# Patient Record
Sex: Male | Born: 1960 | Race: White | Hispanic: No | Marital: Married | Smoking: Never smoker
Health system: Southern US, Community
[De-identification: ages and names within clinical notes are randomized; demographics above are authoritative.]

## PROBLEM LIST (undated history)

## (undated) DIAGNOSIS — F419 Anxiety disorder, unspecified: Secondary | ICD-10-CM

## (undated) DIAGNOSIS — F329 Major depressive disorder, single episode, unspecified: Secondary | ICD-10-CM

## (undated) DIAGNOSIS — R569 Unspecified convulsions: Secondary | ICD-10-CM

## (undated) DIAGNOSIS — F32A Depression, unspecified: Secondary | ICD-10-CM

## (undated) DIAGNOSIS — Z87442 Personal history of urinary calculi: Secondary | ICD-10-CM

## (undated) DIAGNOSIS — F909 Attention-deficit hyperactivity disorder, unspecified type: Secondary | ICD-10-CM

## (undated) DIAGNOSIS — T7840XA Allergy, unspecified, initial encounter: Secondary | ICD-10-CM

## (undated) DIAGNOSIS — L409 Psoriasis, unspecified: Secondary | ICD-10-CM

## (undated) DIAGNOSIS — Z86010 Personal history of colonic polyps: Secondary | ICD-10-CM

## (undated) HISTORY — PX: OTHER SURGICAL HISTORY: SHX169

## (undated) HISTORY — DX: Major depressive disorder, single episode, unspecified: F32.9

## (undated) HISTORY — PX: TRIGGER FINGER RELEASE: SHX641

## (undated) HISTORY — DX: Anxiety disorder, unspecified: F41.9

## (undated) HISTORY — DX: Attention-deficit hyperactivity disorder, unspecified type: F90.9

## (undated) HISTORY — DX: Unspecified convulsions: R56.9

## (undated) HISTORY — DX: Psoriasis, unspecified: L40.9

## (undated) HISTORY — DX: Allergy, unspecified, initial encounter: T78.40XA

## (undated) HISTORY — DX: Depression, unspecified: F32.A

---

## 2002-08-11 ENCOUNTER — Encounter: Payer: Self-pay | Admitting: Neurology

## 2002-08-11 ENCOUNTER — Ambulatory Visit (HOSPITAL_COMMUNITY): Admission: RE | Admit: 2002-08-11 | Discharge: 2002-08-11 | Payer: Self-pay | Admitting: Neurology

## 2004-08-18 ENCOUNTER — Ambulatory Visit (HOSPITAL_COMMUNITY): Admission: RE | Admit: 2004-08-18 | Discharge: 2004-08-18 | Payer: Self-pay | Admitting: Pulmonary Disease

## 2006-06-25 ENCOUNTER — Ambulatory Visit (HOSPITAL_COMMUNITY): Admission: RE | Admit: 2006-06-25 | Discharge: 2006-06-25 | Payer: Self-pay | Admitting: Pulmonary Disease

## 2012-03-07 DIAGNOSIS — H11062 Recurrent pterygium of left eye: Secondary | ICD-10-CM | POA: Insufficient documentation

## 2012-08-06 HISTORY — PX: EYE SURGERY: SHX253

## 2013-03-14 DIAGNOSIS — H40049 Steroid responder, unspecified eye: Secondary | ICD-10-CM | POA: Insufficient documentation

## 2013-10-16 DIAGNOSIS — L718 Other rosacea: Secondary | ICD-10-CM | POA: Insufficient documentation

## 2014-09-21 DIAGNOSIS — M549 Dorsalgia, unspecified: Secondary | ICD-10-CM | POA: Insufficient documentation

## 2014-09-21 DIAGNOSIS — F900 Attention-deficit hyperactivity disorder, predominantly inattentive type: Secondary | ICD-10-CM | POA: Insufficient documentation

## 2014-09-21 DIAGNOSIS — F319 Bipolar disorder, unspecified: Secondary | ICD-10-CM | POA: Insufficient documentation

## 2014-09-21 DIAGNOSIS — M6530 Trigger finger, unspecified finger: Secondary | ICD-10-CM | POA: Insufficient documentation

## 2015-11-09 DIAGNOSIS — Z0000 Encounter for general adult medical examination without abnormal findings: Secondary | ICD-10-CM | POA: Insufficient documentation

## 2016-01-18 ENCOUNTER — Encounter: Payer: Self-pay | Admitting: Gastroenterology

## 2016-03-13 ENCOUNTER — Ambulatory Visit (AMBULATORY_SURGERY_CENTER): Payer: Self-pay | Admitting: *Deleted

## 2016-03-13 ENCOUNTER — Encounter: Payer: Self-pay | Admitting: Gastroenterology

## 2016-03-13 VITALS — Ht 69.0 in | Wt 165.0 lb

## 2016-03-13 DIAGNOSIS — Z1211 Encounter for screening for malignant neoplasm of colon: Secondary | ICD-10-CM

## 2016-03-13 MED ORDER — NA SULFATE-K SULFATE-MG SULF 17.5-3.13-1.6 GM/177ML PO SOLN: 1.0000 | Freq: Once | ORAL | 0 refills | Status: AC

## 2016-03-27 ENCOUNTER — Encounter: Payer: Self-pay | Admitting: Gastroenterology

## 2016-03-27 ENCOUNTER — Ambulatory Visit (AMBULATORY_SURGERY_CENTER): Payer: PRIVATE HEALTH INSURANCE | Admitting: Gastroenterology

## 2016-03-27 VITALS — BP 113/78 | HR 64 | Temp 98.0°F | Resp 18 | Ht 69.0 in | Wt 165.0 lb

## 2016-03-27 DIAGNOSIS — D125 Benign neoplasm of sigmoid colon: Secondary | ICD-10-CM | POA: Diagnosis not present

## 2016-03-27 DIAGNOSIS — Z1211 Encounter for screening for malignant neoplasm of colon: Secondary | ICD-10-CM

## 2016-03-27 DIAGNOSIS — D123 Benign neoplasm of transverse colon: Secondary | ICD-10-CM

## 2016-03-27 MED ORDER — SODIUM CHLORIDE 0.9 % IV SOLN: 500.0000 mL | INTRAVENOUS | Status: AC

## 2016-03-27 NOTE — Progress Notes (Signed)
Patient did lie down on the bed and brought his knees up to pass a lot of gas. Dr. Ardis Hughs was aware of the situation. Patient's pain was a #2-3 when he was discharged at 11:45am. Patient smiling at this time.

## 2016-03-27 NOTE — Progress Notes (Signed)
Patient was about to be discharged and he stated that his tomach hurt. High abd pain about #6.  Patient given levsin 0.125mg  for relief.  Patient on toilet with A LITTLE RELIEF. Dr. Ardis Hughs eamined the patient and 84ml of simethicone were given around 11 am. Patient is walking around in the unit for relief.  Wife is with him at this time. No discharge yet.

## 2016-03-27 NOTE — Op Note (Signed)
[facility] Patient Name: Craig Nunez Procedure Date: 03/27/2016 9:48 AM MRN: [id number] Endoscopist: Milus Banister , MD Age: 55 Referring MD:  Date of Birth: 07/20/1961 Gender: Male Account #: [id number] Procedure:                Colonoscopy Indications:              Screening for colorectal malignant neoplasm Medicines:                Monitored Anesthesia Care Procedure:                Pre-Anesthesia Assessment:                           - Prior to the procedure, a History and Physical                            was performed, and patient medications and                            allergies were reviewed. The patient's tolerance of                            previous anesthesia was also reviewed. The risks                            and benefits of the procedure and the sedation                            options and risks were discussed with the patient.                            All questions were answered, and informed consent                            was obtained. Prior Anticoagulants: The patient has                            taken no previous anticoagulant or antiplatelet                            agents. ASA Grade Assessment: II - A patient with                            mild systemic disease. After reviewing the risks                            and benefits, the patient was deemed in                            satisfactory condition to undergo the procedure.                           After obtaining informed consent, the colonoscope  was passed under direct vision. Throughout the                            procedure, the patient's blood pressure, pulse, and                            oxygen saturations were monitored continuously. The                            Model PCF-[id number]) scope was introduced                            through the anus and advanced to the the cecum,                            identified by  appendiceal orifice and ileocecal                            valve. The colonoscopy was performed without                            difficulty. The patient tolerated the procedure                            well. The quality of the bowel preparation was                            good. The ileocecal valve, appendiceal orifice, and                            rectum were photographed. Scope In: 9:54:18 AM Scope Out: 10:07:05 AM Scope Withdrawal Time: 0 hours 11 minutes 0 seconds  Total Procedure Duration: 0 hours 12 minutes 47 seconds  Findings:                 Two sessile polyps were found in the sigmoid colon                            and transverse colon. The polyps were 2 to 4 mm in                            size. These polyps were removed with a cold snare.                            Resection and retrieval were complete.                           The exam was otherwise without abnormality on                            direct and retroflexion views. Complications:            No immediate complications. Estimated blood loss:  None. Estimated Blood Loss:     Estimated blood loss: none. Impression:               - Two 2 to 4 mm polyps in the sigmoid colon and in                            the transverse colon, removed with a cold snare.                            Resected and retrieved.                           - The examination was otherwise normal on direct                            and retroflexion views. Recommendation:           - Patient has a contact number available for                            emergencies. The signs and symptoms of potential                            delayed complications were discussed with the                            patient. Return to normal activities tomorrow.                            Written discharge instructions were provided to the                            patient.                           - Resume previous  diet.                           - Continue present medications.                           You will receive a letter within 2-3 weeks with the                            pathology results and my final recommendations.                           If the polyp(s) is proven to be 'pre-cancerous' on                            pathology, you will need repeat colonoscopy in 5                            years. If the polyp(s) is NOT 'precancerous' on  pathology then you should repeat colon cancer                            screening in 10 years with colonoscopy without need                            for colon cancer screening by any method prior to                            then (including stool testing). Milus Banister, MD 03/27/2016 10:09:04 AM This report has been signed electronically.

## 2016-03-27 NOTE — Patient Instructions (Signed)
YOU HAD AN ENDOSCOPIC PROCEDURE TODAY AT THE [facility]:   Refer to the procedure report that was given to you for any specific questions about what was found during the examination.  If the procedure report does not answer your questions, please call your gastroenterologist to clarify.  If you requested that your care partner not be given the details of your procedure findings, then the procedure report has been included in a sealed envelope for you to review at your convenience later.  YOU SHOULD EXPECT: Some feelings of bloating in the abdomen. Passage of more gas than usual.  Walking can help get rid of the air that was put into your GI tract during the procedure and reduce the bloating. If you had a lower endoscopy (such as a colonoscopy or flexible sigmoidoscopy) you may notice spotting of blood in your stool or on the toilet paper. If you underwent a bowel prep for your procedure, you may not have a normal bowel movement for a few days.  Please Note:  You might notice some irritation and congestion in your nose or some drainage.  This is from the oxygen used during your procedure.  There is no need for concern and it should clear up in a day or so.  SYMPTOMS TO REPORT IMMEDIATELY:   Following lower endoscopy (colonoscopy or flexible sigmoidoscopy):  Excessive amounts of blood in the stool  Significant tenderness or worsening of abdominal pains  Swelling of the abdomen that is new, acute  Fever of 100F or higher   For urgent or emergent issues, a gastroenterologist can be reached at any hour by calling [phone].   DIET:  We do recommend a small meal at first, but then you may proceed to your regular diet.  Drink plenty of fluids but you should avoid alcoholic beverages for 24 hours.  ACTIVITY:  You should plan to take it easy for the rest of today and you should NOT DRIVE or use heavy machinery until tomorrow (because of the sedation medicines used during the test).     FOLLOW UP: Our staff will call the number listed on your records the next business day following your procedure to check on you and address any questions or concerns that you may have regarding the information given to you following your procedure. If we do not reach you, we will leave a message.  However, if you are feeling well and you are not experiencing any problems, there is no need to return our call.  We will assume that you have returned to your regular daily activities without incident.  If any biopsies were taken you will be contacted by phone or by letter within the next 1-3 weeks.  Please call us at [phone] if you have not heard about the biopsies in 3 weeks.    SIGNATURES/CONFIDENTIALITY: You and/or your care partner have signed paperwork which will be entered into your electronic medical record.  These signatures attest to the fact that that the information above on your After Visit Summary has been reviewed and is understood.  Full responsibility of the confidentiality of this discharge information lies with you and/or your care-partner.  Read all of the handouts given to you by your recovery room nurse. Thank-you for choosing us for your healthcare needs today. 

## 2016-03-27 NOTE — Progress Notes (Signed)
Called to room to assist during endoscopic procedure.  Patient ID and intended procedure confirmed with present staff. Received instructions for my participation in the procedure from the performing physician.  

## 2016-03-27 NOTE — Progress Notes (Signed)
Report to PACU, RN, vss, BBS= Clear.  

## 2016-03-28 ENCOUNTER — Telehealth: Payer: Self-pay | Admitting: *Deleted

## 2016-03-28 NOTE — Telephone Encounter (Signed)
  Follow up Call-  Call back number 03/27/2016  Post procedure Call Back phone  # [phone]  Permission to leave phone message Yes  Some recent data might be hidden     Patient questions:  Do you have a fever, pain , or abdominal swelling? No. Pain Score  0 *  Have you tolerated food without any problems? Yes.    Have you been able to return to your normal activities? Yes.    Do you have any questions about your discharge instructions: Diet   No. Medications  No. Follow up visit  No.  Do you have questions or concerns about your Care? No.  Actions: * If pain score is 4 or above: No action needed, pain <4.

## 2016-03-30 ENCOUNTER — Encounter: Payer: Self-pay | Admitting: Gastroenterology

## 2016-11-23 ENCOUNTER — Encounter (HOSPITAL_COMMUNITY): Payer: Self-pay | Admitting: Emergency Medicine

## 2016-11-23 ENCOUNTER — Emergency Department (HOSPITAL_COMMUNITY)
Admission: EM | Admit: 2016-11-23 | Discharge: 2016-11-23 | Disposition: A | Discharge status: Home / Self Care | Payer: No Typology Code available for payment source | Attending: Emergency Medicine | Admitting: Emergency Medicine

## 2016-11-23 ENCOUNTER — Emergency Department (HOSPITAL_COMMUNITY): Payer: No Typology Code available for payment source

## 2016-11-23 DIAGNOSIS — N200 Calculus of kidney: Secondary | ICD-10-CM

## 2016-11-23 DIAGNOSIS — Z79899 Other long term (current) drug therapy: Secondary | ICD-10-CM | POA: Insufficient documentation

## 2016-11-23 DIAGNOSIS — N1330 Unspecified hydronephrosis: Secondary | ICD-10-CM | POA: Diagnosis not present

## 2016-11-23 DIAGNOSIS — F909 Attention-deficit hyperactivity disorder, unspecified type: Secondary | ICD-10-CM | POA: Insufficient documentation

## 2016-11-23 DIAGNOSIS — R1031 Right lower quadrant pain: Secondary | ICD-10-CM | POA: Diagnosis present

## 2016-11-23 DIAGNOSIS — N202 Calculus of kidney with calculus of ureter: Secondary | ICD-10-CM | POA: Insufficient documentation

## 2016-11-23 LAB — CBC
HCT: 39.5 % (ref 39.0–52.0)
Hemoglobin: 13.3 g/dL (ref 13.0–17.0)
MCH: 29.1 pg (ref 26.0–34.0)
MCHC: 33.7 g/dL (ref 30.0–36.0)
MCV: 86.4 fL (ref 78.0–100.0)
PLATELETS: 247 10*3/uL (ref 150–400)
RBC: 4.57 MIL/uL (ref 4.22–5.81)
RDW: 12.6 % (ref 11.5–15.5)
WBC: 7.4 10*3/uL (ref 4.0–10.5)

## 2016-11-23 LAB — URINALYSIS, ROUTINE W REFLEX MICROSCOPIC
BILIRUBIN URINE: NEGATIVE
GLUCOSE, UA: NEGATIVE mg/dL
Ketones, ur: NEGATIVE mg/dL
LEUKOCYTES UA: NEGATIVE
Nitrite: NEGATIVE
Protein, ur: 30 mg/dL — AB
SPECIFIC GRAVITY, URINE: 1.021 (ref 1.005–1.030)
Squamous Epithelial / LPF: NONE SEEN
pH: 5 (ref 5.0–8.0)

## 2016-11-23 LAB — COMPREHENSIVE METABOLIC PANEL
ALT: 24 U/L (ref 17–63)
AST: 19 U/L (ref 15–41)
Albumin: 3.7 g/dL (ref 3.5–5.0)
Alkaline Phosphatase: 66 U/L (ref 38–126)
Anion gap: 11 (ref 5–15)
BILIRUBIN TOTAL: 0.6 mg/dL (ref 0.3–1.2)
BUN: 15 mg/dL (ref 6–20)
CALCIUM: 9.4 mg/dL (ref 8.9–10.3)
CHLORIDE: 104 mmol/L (ref 101–111)
CO2: 26 mmol/L (ref 22–32)
CREATININE: 1.13 mg/dL (ref 0.61–1.24)
Glucose, Bld: 116 mg/dL — ABNORMAL HIGH (ref 65–99)
Potassium: 3.6 mmol/L (ref 3.5–5.1)
Sodium: 141 mmol/L (ref 135–145)
TOTAL PROTEIN: 6.2 g/dL — AB (ref 6.5–8.1)

## 2016-11-23 LAB — LIPASE, BLOOD: LIPASE: 19 U/L (ref 11–51)

## 2016-11-23 MED ORDER — KETOROLAC TROMETHAMINE 30 MG/ML IJ SOLN
30.0000 mg | Freq: Once | INTRAMUSCULAR | Status: AC
  Administered 2016-11-23: 30 mg via INTRAVENOUS
  Filled 2016-11-23: qty 1

## 2016-11-23 MED ORDER — TAMSULOSIN HCL 0.4 MG PO CAPS: 0.4000 mg | ORAL_CAPSULE | Freq: Two times a day (BID) | ORAL | 0 refills | Status: DC

## 2016-11-23 MED ORDER — OXYCODONE-ACETAMINOPHEN 5-325 MG PO TABS: 1.0000 | ORAL_TABLET | ORAL | 0 refills | Status: DC | PRN

## 2016-11-23 MED ORDER — ONDANSETRON HCL 4 MG/2ML IJ SOLN
4.0000 mg | Freq: Once | INTRAMUSCULAR | Status: AC
  Administered 2016-11-23: 4 mg via INTRAVENOUS
  Filled 2016-11-23: qty 2

## 2016-11-23 MED ORDER — MORPHINE SULFATE (PF) 4 MG/ML IV SOLN
4.0000 mg | Freq: Once | INTRAVENOUS | Status: AC
Start: 2016-11-23 — End: 2016-11-23
  Administered 2016-11-23: 4 mg via INTRAVENOUS
  Filled 2016-11-23: qty 1

## 2016-11-23 MED ORDER — SODIUM CHLORIDE 0.9 % IV BOLUS (SEPSIS)
1000.0000 mL | Freq: Once | INTRAVENOUS | Status: AC
  Administered 2016-11-23: 1000 mL via INTRAVENOUS

## 2016-11-23 MED ORDER — ONDANSETRON 4 MG PO TBDP: 4.0000 mg | ORAL_TABLET | Freq: Three times a day (TID) | ORAL | 0 refills | Status: DC | PRN

## 2016-11-23 NOTE — ED Notes (Signed)
Returned to ED from CT.  ?

## 2016-11-23 NOTE — ED Triage Notes (Signed)
Pt from home with c/o right sided lower back radiating to the right side of his abdomen. Pain has been intermittent. Pt reports dark urine, nausea, and chills.

## 2016-11-23 NOTE — ED Provider Notes (Signed)
[facility] DEPT Provider Note   CSN: [id number] Arrival date & time: 11/23/16  0636     History   Chief Complaint Chief Complaint  Patient presents with  . Back Pain  . Abdominal Pain    HPI Craig Nunez is a 56 y.o. male.  HPI   Patient is a 56 year old male with history of depression who presents the ED with complaint of right flank pain, onset 6 PM last night. Patient reports he has been having waxing and waning sharp pressure-like pain to his right flank that radiates around to his right lower abdomen. Denies any aggravating or alleviating factors. Denies history of similar symptoms. Patient reports when the pain gets severe he has associated nausea, chills and diaphoresis which resolved when the pain improves. He states he took Tylenol this morning without relief. He also reports having mild dysuria 2 days ago which has since resolved. Denies fever, cough, shortness of breath, chest pain, vomiting, diarrhea, hematuria, saddle anesthesia, numbness, weakness. Denies history of abdominal surgeries. Denies history of kidney stones. Denies any recent fall, trauma or heavy lifting.  Past Medical History:  Diagnosis Date  . ADHD (attention deficit hyperactivity disorder)   . Allergy   . Anxiety   . Depression   . Seizures ([place])    1997 from 02 deprivation    There are no active problems to display for this patient.   Past Surgical History:  Procedure Laterality Date  . EYE SURGERY  2014   left eye   . TRIGGER FINGER RELEASE     11-05-2014 right index middle        Home Medications    Prior to Admission medications   Medication Sig Start Date End Date Taking? Authorizing Provider  Chlorpheniramine-PSE-Ibuprofen (ADVIL ALLERGY SINUS) 2-30-200 MG TABS Take 1 tablet by mouth daily as needed (sinus pain).    Yes Historical Provider, MD  desvenlafaxine (PRISTIQ) 50 MG 24 hr tablet Take 50 mg by mouth daily.    Yes Historical Provider, MD  lamoTRIgine (LAMICTAL) 100  MG tablet Take 200 mg by mouth at bedtime.    Yes Historical Provider, MD  ondansetron (ZOFRAN ODT) 4 MG disintegrating tablet Take 1 tablet (4 mg total) by mouth every 8 (eight) hours as needed for nausea or vomiting. 11/23/16   Nona Dell, PA-C  oxyCODONE-acetaminophen (PERCOCET/ROXICET) 5-325 MG tablet Take 1-2 tablets by mouth every 4 (four) hours as needed for severe pain. 11/23/16   Nona Dell, PA-C  tamsulosin (FLOMAX) 0.4 MG CAPS capsule Take 1 capsule (0.4 mg total) by mouth 2 (two) times daily. 11/23/16   Nona Dell, PA-C    Family History Family History  Problem Relation Age of Onset  . Breast cancer Mother   . Breast cancer Sister   . Colon polyps Brother   . Colon cancer Neg Hx   . Esophageal cancer Neg Hx   . Rectal cancer Neg Hx   . Stomach cancer Neg Hx     Social History Social History  Substance Use Topics  . Smoking status: Never Smoker  . Smokeless tobacco: Never Used  . Alcohol use No     Allergies   Penicillins   Review of Systems Review of Systems  Constitutional: Positive for chills (intermittent) and diaphoresis (intermittent).  Gastrointestinal: Positive for abdominal pain (RLQ) and nausea.  Genitourinary: Positive for dysuria and flank pain (right).  All other systems reviewed and are negative.    Physical Exam Updated Vital Signs BP 119/77  Pulse (!) 58   Temp 97.5 F (36.4 C) (Oral)   Resp 12   Ht 5\' 9"  (1.753 m)   Wt 77.1 kg   SpO2 99%   BMI 25.10 kg/m   Physical Exam  Constitutional: He is oriented to person, place, and time. He appears well-developed and well-nourished. No distress.  HENT:  Head: Normocephalic and atraumatic.  Mouth/Throat: Uvula is midline, oropharynx is clear and moist and mucous membranes are normal. No oropharyngeal exudate, posterior oropharyngeal edema, posterior oropharyngeal erythema or tonsillar abscesses. No tonsillar exudate.  Eyes: Conjunctivae and EOM are  normal. Right eye exhibits no discharge. Left eye exhibits no discharge. No scleral icterus.  Neck: Normal range of motion. Neck supple.  Cardiovascular: Normal rate, regular rhythm, normal heart sounds and intact distal pulses.   Pulmonary/Chest: Effort normal and breath sounds normal. No respiratory distress. He has no wheezes. He has no rales. He exhibits no tenderness.  Abdominal: Soft. Normal appearance and bowel sounds are normal. He exhibits no distension and no mass. There is tenderness. There is CVA tenderness (right). There is no rigidity, no rebound, no guarding and negative Murphy's sign. No hernia.  Mild TTP over lower abdominal quadrants.  Musculoskeletal: Normal range of motion. He exhibits no edema or tenderness.  No midline C, T, or L tenderness. Full range of motion of neck and back. Full range of motion of bilateral upper and lower extremities, with 5/5 strength. Sensation intact. 2+ radial and PT pulses. Cap refill <2 seconds.   Neurological: He is alert and oriented to person, place, and time.  Skin: Skin is warm and dry. He is not diaphoretic.  Nursing note and vitals reviewed.    ED Treatments / Results  Labs (all labs ordered are listed, but only abnormal results are displayed) Labs Reviewed  COMPREHENSIVE METABOLIC PANEL - Abnormal; Notable for the following:       Result Value   Glucose, Bld 116 (*)    Total Protein 6.2 (*)    All other components within normal limits  URINALYSIS, ROUTINE W REFLEX MICROSCOPIC - Abnormal; Notable for the following:    APPearance HAZY (*)    Hgb urine dipstick MODERATE (*)    Protein, ur 30 (*)    Bacteria, UA MANY (*)    All other components within normal limits  URINE CULTURE  LIPASE, BLOOD  CBC    EKG  EKG Interpretation None       Radiology Ct Renal Stone Study  Result Date: 11/23/2016 CLINICAL DATA:  On and off right flank pain since yesterday. EXAM: CT ABDOMEN AND PELVIS WITHOUT CONTRAST TECHNIQUE:  Multidetector CT imaging of the abdomen and pelvis was performed following the standard protocol without IV contrast. COMPARISON:  None. FINDINGS: Lower chest:  Negative Hepatobiliary: Subcentimeter low-density in the left liver, usually a cyst.Cholelithiasis without signs of cholecystitis. No common bile duct distention. Pancreas: Unremarkable. Spleen: Unremarkable. Adrenals/Urinary Tract:  Negative adrenals. 2 mm stone in the mid right ureter, at the level of L4. Mild hydronephrosis and upper hydroureter on the right. 3 mm right renal calculus. No left-sided hydronephrosis. Negative decompressed bladder. Stomach/Bowel:  No obstruction. No appendicitis. Vascular/Lymphatic: No acute vascular abnormality. No mass or adenopathy. Reproductive:Nonspecific prostate calcifications. Other: No ascites or pneumoperitoneum. Musculoskeletal: No acute abnormalities. IMPRESSION: 1. 2 mm right mid ureteral stone with mild hydronephrosis. 2. 3 mm right renal calculus. 3. Cholelithiasis. Electronically Signed   By: Monte Fantasia M.D.   On: 11/23/2016 08:27    Procedures Procedures (  including critical care time)  Medications Ordered in ED Medications  sodium chloride 0.9 % bolus 1,000 mL (0 mLs Intravenous Stopped 11/23/16 1022)  ketorolac (TORADOL) 30 MG/ML injection 30 mg (30 mg Intravenous Given 11/23/16 0743)  morphine 4 MG/ML injection 4 mg (4 mg Intravenous Given 11/23/16 0854)  ondansetron (ZOFRAN) injection 4 mg (4 mg Intravenous Given 11/23/16 0854)     Initial Impression / Assessment and Plan / ED Course  I have reviewed the triage vital signs and the nursing notes.  Pertinent labs & imaging results that were available during my care of the patient were reviewed by me and considered in my medical decision making (see chart for details).    Pt presents with waxing and waning right flank pain with intermittent radiation to his right lower quadrant that started last night. Endorses associated nausea. Denies  fever. Denies history of kidney stones. VSS. Exam revealed right CVA tenderness, mild tenderness over lower abdominal quadrants, no peritoneal signs. No midline spinal tenderness. Remaining exam unremarkable. Patient given IV fluids and Toradol in the ED, declined antiemetics and narcotics. UA positive for hgb. Labs unremarkable. Will order CT renal study for further evaluation of suspected kidney stone.  CT showed 2 mm right mid ureteral stone with mild hydronephrosis, 3 mm right renal calculus. Serum creatine WNL, vitals sign stable and the pt does not have irratractable vomiting. Reevaluation patient is sitting resting comfortably in bed with pain controlled. Patient able to tolerate PO. Discussed results and plan for discharge with patient. Plan to discharge patient home with pain meds, antiemetics, Flomax and symptomatically treatment. Patient given outpatient follow-up information as needed. Discussed return precautions.  Final Clinical Impressions(s) / ED Diagnoses   Final diagnoses:  Kidney stone    New Prescriptions Discharge Medication List as of 11/23/2016 10:35 AM    START taking these medications   Details  ondansetron (ZOFRAN ODT) 4 MG disintegrating tablet Take 1 tablet (4 mg total) by mouth every 8 (eight) hours as needed for nausea or vomiting., Starting Fri 11/23/2016, Print    oxyCODONE-acetaminophen (PERCOCET/ROXICET) 5-325 MG tablet Take 1-2 tablets by mouth every 4 (four) hours as needed for severe pain., Starting Fri 11/23/2016, Print    tamsulosin (FLOMAX) 0.4 MG CAPS capsule Take 1 capsule (0.4 mg total) by mouth 2 (two) times daily., Starting Fri 11/23/2016, Print         [place], PA-C 11/23/16 [place], MD 11/24/16 [phone]

## 2016-11-23 NOTE — ED Notes (Signed)
Pt given ginger ale.

## 2016-11-23 NOTE — ED Notes (Signed)
Patient transported to CT 

## 2016-11-23 NOTE — Discharge Instructions (Signed)
Take your medications as prescribed as needed for pain and nausea. Continue drinking water at home to remain hydrated. Strain your urine for the next few days until you have passed your kidney stone. I recommend following up with your primary care provider on Monday for her symptoms have not improved. Please return to the Emergency Department if symptoms worsen or new onset of fever, chest pain, difficulty breathing, new/worsening abdominal pain, vomiting, unable to keep fluids down, difficulty urinating.

## 2016-11-23 NOTE — ED Notes (Signed)
Strainer given  

## 2016-11-24 LAB — URINE CULTURE: Culture: NO GROWTH

## 2018-03-18 ENCOUNTER — Other Ambulatory Visit: Payer: Self-pay | Admitting: Urology

## 2018-03-24 ENCOUNTER — Encounter (HOSPITAL_COMMUNITY): Payer: Self-pay | Admitting: General Practice

## 2018-03-27 ENCOUNTER — Ambulatory Visit (HOSPITAL_COMMUNITY): Payer: PRIVATE HEALTH INSURANCE

## 2018-03-27 ENCOUNTER — Encounter (HOSPITAL_COMMUNITY): Payer: Self-pay | Admitting: *Deleted

## 2018-03-27 ENCOUNTER — Ambulatory Visit (HOSPITAL_COMMUNITY)
Admission: RE | Admit: 2018-03-27 | Discharge: 2018-03-27 | Disposition: A | Discharge status: Home / Self Care | Payer: PRIVATE HEALTH INSURANCE | Source: Other Acute Inpatient Hospital | Attending: Urology | Admitting: Urology

## 2018-03-27 ENCOUNTER — Encounter (HOSPITAL_COMMUNITY)
Admission: RE | Disposition: A | Discharge status: Home / Self Care | Payer: Self-pay | Source: Other Acute Inpatient Hospital | Attending: Urology

## 2018-03-27 ENCOUNTER — Other Ambulatory Visit: Payer: Self-pay

## 2018-03-27 DIAGNOSIS — L409 Psoriasis, unspecified: Secondary | ICD-10-CM | POA: Diagnosis not present

## 2018-03-27 DIAGNOSIS — N202 Calculus of kidney with calculus of ureter: Secondary | ICD-10-CM | POA: Diagnosis present

## 2018-03-27 DIAGNOSIS — Z880 Allergy status to penicillin: Secondary | ICD-10-CM | POA: Diagnosis not present

## 2018-03-27 DIAGNOSIS — F329 Major depressive disorder, single episode, unspecified: Secondary | ICD-10-CM | POA: Diagnosis not present

## 2018-03-27 DIAGNOSIS — Z79899 Other long term (current) drug therapy: Secondary | ICD-10-CM | POA: Diagnosis not present

## 2018-03-27 DIAGNOSIS — N201 Calculus of ureter: Secondary | ICD-10-CM

## 2018-03-27 HISTORY — PX: EXTRACORPOREAL SHOCK WAVE LITHOTRIPSY: SHX1557

## 2018-03-27 HISTORY — DX: Personal history of urinary calculi: Z87.442

## 2018-03-27 HISTORY — DX: Personal history of colonic polyps: Z86.010

## 2018-03-27 SURGERY — LITHOTRIPSY, ESWL
Anesthesia: LOCAL | Laterality: Right

## 2018-03-27 MED ORDER — OXYCODONE HCL 5 MG PO TABS
5.0000 mg | ORAL_TABLET | ORAL | Status: DC | PRN
  Administered 2018-03-27: 5 mg via ORAL

## 2018-03-27 MED ORDER — OXYCODONE HCL 5 MG PO TABS
ORAL_TABLET | ORAL | Status: AC
  Filled 2018-03-27: qty 1

## 2018-03-27 MED ORDER — DIAZEPAM 5 MG PO TABS
10.0000 mg | ORAL_TABLET | ORAL | Status: AC
  Administered 2018-03-27: 10 mg via ORAL
  Filled 2018-03-27: qty 2

## 2018-03-27 MED ORDER — DIPHENHYDRAMINE HCL 25 MG PO CAPS
25.0000 mg | ORAL_CAPSULE | ORAL | Status: AC
  Administered 2018-03-27: 25 mg via ORAL
  Filled 2018-03-27: qty 1

## 2018-03-27 MED ORDER — SODIUM CHLORIDE 0.9 % IV SOLN
INTRAVENOUS | Status: DC
  Administered 2018-03-27: 07:00:00 via INTRAVENOUS

## 2018-03-27 MED ORDER — CIPROFLOXACIN HCL 500 MG PO TABS
500.0000 mg | ORAL_TABLET | ORAL | Status: AC
  Administered 2018-03-27: 500 mg via ORAL
  Filled 2018-03-27: qty 1

## 2018-03-27 NOTE — Op Note (Signed)
See [facility] OP note scanned into chart. Also because of the size, density, location and other factors that cannot be anticipated I feel this will likely be a staged procedure. This fact supersedes any indication in the scanned [facility] operative note to the contrary.  

## 2018-03-27 NOTE — H&P (Signed)
Craig Nunez is a 57 year-old male established patient who is here for renal calculi.  September 2018: CT 11/23/2016 revealed a 1-2 mm right proximal stone with mild hydroneprhosis. There was also a 1-2 mm RUP stone. UA showed 6-30 rbc and wbc, many bac. Cx was negative. Bun 15, cr 1.13. He had intermittent flow and red urine a few days later then his symptoms resolved. Two weeks later he had flank pain again and frequency. He took another round of tamsulosin. Then his symptoms cleared. UA also normal.   Prior oligospermia eval.   He returns with a renal ultrasound which showed no hydronephrosis resolved. There was a 5 mm hyperechoic density in the right kidney consistent with the stone seen on the CT scan. KUB clear. He increased his water intake.    01/01/18: Patient with history noted above. He presents today with complaints of bilateral lower back pain and flank pain that began about 4 days ago. He states that his pain seems to be more bothersome on the right side. He describes the pain as intermittent with more severe episodes. He has noted some associated nausea. He also notes intermittent chills and sweats. He denies fever, however. He denies exacerbation of voiding symptoms or difficulties voiding. No dysuria or gross hematuria.   01/15/18: He returns today for follow up. He denies seeing a stone pass in the interim. He states that pain has improved. He does continue to have some intermittent right flank pain with associated nausea; however, he states that this pain does not occur on a daily basis and is manageable. He denies any gross hematuria or changes in voiding habits. No fever or chills.   03/03/18: He returns today for follow up. He states that since being seen last he did have an episode of acute right flank pain associated with hematuria about 3 weeks ago. He also had nausea, at the time. He states that this pain lasted for a few hours, then passed. He denies seeing a stone pass. He has  continues to have some intermittent right lower back pain at times. He denies any recent episodes of gross hematuria. He denies exacerbation of voiding symptoms. No gross hematuria, dysuria, fever, or chills.   03/14/18: He returns today for follow up. He denies seeing a stone pass in the interim. Overall, he continues to do well with minimal symptoms. He will note some right flank pain on occasion. He denies gross hematuria, dysuria, nausea, vomiting, fever, or chills. He feels he is voiding at his baseline.   The problem is on the right side. He first stated noticing pain on 11/23/2016. This is not his first kidney stone. He is not currently having flank pain, back pain, groin pain, nausea, vomiting, fever or chills.     ALLERGIES: Penicillins    MEDICATIONS: Tamsulosin Hcl 0.4 mg capsule 1 capsule PO Daily  Clobex 0.05 % spray, non-aerosol  LamoTRIgine 200 MG Oral Tablet Oral  Meloxicam  Pristiq 50 MG Oral Tablet Extended Release 24 Hour Oral  Vyvanse 40 mg capsule     GU PSH: None     PSH Notes: Ankle Surgery   Left eye 2014    Right hand 2016   NON-GU PSH: None   GU PMH: Renal calculus - 03/03/2018, - 01/15/2018, - 01/01/2018, - 05/01/2017, - 01/16/2017 Flank Pain - 01/01/2018 Low back pain - 01/01/2018 Ureteral calculus - 01/16/2017 Joslyn Devon - 2014      PMH Notes:  1898-08-06 00:00:00 - Note: Normal Routine  History And Physical Adult   NON-GU PMH: Personal history of diseases of the skin and subcutaneous tissue, History of psoriasis - October 29, 2012 Personal history of other mental and behavioral disorders, History of depression - 10/29/12    FAMILY HISTORY: Death In The Family Father - Father nephrolithiasis - Father   SOCIAL HISTORY: Marital Status: Married Preferred Language: English; Ethnicity: Not Hispanic Or Latino; Race: White Current Smoking Status: Patient has never smoked.   Tobacco Use Assessment Completed: Used Tobacco in last 30 days? Does not use  smokeless tobacco. Does not drink anymore.  Drinks 3 caffeinated drinks per day. Patient's occupation Theatre manager.    REVIEW OF SYSTEMS:    GU Review Male:   Patient denies frequent urination, hard to postpone urination, burning/ pain with urination, get up at night to urinate, leakage of urine, stream starts and stops, trouble starting your stream, have to strain to urinate , erection problems, and penile pain.  Gastrointestinal (Upper):   Patient denies nausea, vomiting, and indigestion/ heartburn.  Gastrointestinal (Lower):   Patient denies diarrhea and constipation.  Constitutional:   Patient denies fever, night sweats, weight loss, and fatigue.  Skin:   Patient denies skin rash/ lesion and itching.  Eyes:   Patient denies blurred vision and double vision.  Ears/ Nose/ Throat:   Patient denies sore throat and sinus problems.  Hematologic/Lymphatic:   Patient denies swollen glands and easy bruising.  Cardiovascular:   Patient denies leg swelling and chest pains.  Respiratory:   Patient denies shortness of breath and cough.  Endocrine:   Patient denies excessive thirst.  Musculoskeletal:   Patient denies back pain and joint pain.  Neurological:   Patient denies headaches and dizziness.  Psychologic:   Patient denies depression and anxiety.   VITAL SIGNS:      03/14/2018 08:35 AM  Weight 168 lb / 76.2 kg  Height 69 in / 175.26 cm  BP 106/65 mmHg  Pulse 85 /min  Temperature 98.2 F / 36.7 C  BMI 24.8 kg/m   MULTI-SYSTEM PHYSICAL EXAMINATION:    Constitutional: Well-nourished. No physical deformities. Normally developed. Good grooming.  Respiratory: Normal breath sounds. No labored breathing, no use of accessory muscles.   Cardiovascular: Regular rate and rhythm. No murmur, no gallop. Normal temperature, normal extremity pulses, no swelling, no varicosities.   Neurologic / Psychiatric: Oriented to time, oriented to place, oriented to person. No depression, no anxiety,  no agitation.  Gastrointestinal: No mass, no tenderness, no rigidity, non obese abdomen.  Musculoskeletal: Spine, ribs, pelvis no bilateral tenderness. Normal gait and station of head and neck.     PAST DATA REVIEWED:  Source Of History:  Patient  Records Review:   Previous Patient Records  Urine Test Review:   Urinalysis  X-Ray Review: KUB: Reviewed Films.  [facility].T. Stone Protocol: Reviewed Films. Reviewed Report.     03/14/11  Hormones  Testosterone, Total 242.91     PROCEDURES:         Renal Ultrasound (Limited) - 59563  Right Kidney: Length: 10.4 cm Depth:5.0 cm Cortical Width: 1.7 cm Width: 5.2 cm    Right Kidney/Ureter:  ? minimal fullness noted. --- 0.4 cm nonshadowing echogenic focus in the mid kidney ( calcification vs vessel).   Bladder:  PVR = not visualized.                KUB - [id number]  A single view of the abdomen is obtained. A single view of the abdomen is obtained.  No obvious opacities noted within the confines of bilateral renal shadows. There continues to be an opacity along expected anatomical course of his right proximal ureter, measuring approximatly 4 X 4 mm. This appears to have progressed minimally since prior imaging study. Stable pelvic calcifications noted. Normal appearing bowel gas pattern.                Urinalysis w/Scope Dipstick Dipstick Cont'd Micro  Color: Yellow Bilirubin: Neg mg/dL WBC/hpf: 0 - 5/hpf  Appearance: Clear Ketones: Neg mg/dL RBC/hpf: 0 - 2/hpf  Specific Gravity: >1.030 Blood: Neg ery/uL Bacteria: Rare (0-9/hpf)  pH: 5.5 Protein: 2+ mg/dL Cystals: NS (Not Seen)  Glucose: Neg mg/dL Urobilinogen: 0.2 mg/dL Casts: NS (Not Seen)    Nitrites: Neg Trichomonas: Not Present    Leukocyte Esterase: Neg leu/uL Mucous: Present      Epithelial Cells: 0 - 5/hpf      Yeast: NS (Not Seen)      Sperm: Present    ASSESSMENT:      ICD-10 Details  1 GU:   Renal calculus - N20.0    PLAN:           Orders Labs Urine Culture           Document Letter(s):  Created for Patient: Clinical Summary         Notes:   I will send urine for culture today. On KUB today, there continues to be an opacity along expected anatomical course of his right proximal ureter, measuring approximatly 4 X 4 mm. This appears to have progressed minimally since prior imaging study. Right RUS shows minimal fullness. We discussed treatment options in detail today. He would like to consider moving forward with surgical intervention at this time. We discussed risks of both ESWL and ureteroscopy.   For ureteroscopy I described the risks  which include general anesthetic complications, bleeding,  infection, damage to contiguous structures, positioning injury, ureteral  stricture, ureteral avulsion, ureteral injury, need for ureteral stent,  inability to perform ureteroscopy, need for an interval procedure, inability to  clear stone burden, stent discomfort and pain.   -For shockwave lithotripsy  I described the risks which include arrhythmia, kidney contusion, kidney  hemorrhage, need for transfusion, back discomfort, flank ecchymosis, flank abrasion, inability to break up stone, inability to pass stone fragments, [place]str[place], infection associated with obstructing stones, need for different surgical procedure and possible need for repeat shockwave lithotripsy.   I will review imaging studies with his urologist for further recommendations and fill out posting sheet accordingly. He remain on MET for now. I encouraged he continue to hydrate well and strain his urine. Return precuations discussed for fever or progressive pain, nausea, or vomiting. He voiced understanding.

## 2018-03-27 NOTE — Discharge Instructions (Signed)
See [facility] discharge instructions in chart.  

## 2018-04-22 ENCOUNTER — Other Ambulatory Visit: Payer: Self-pay | Admitting: Urology

## 2018-04-22 DIAGNOSIS — N200 Calculus of kidney: Secondary | ICD-10-CM

## 2018-04-22 DIAGNOSIS — N281 Cyst of kidney, acquired: Secondary | ICD-10-CM

## 2018-04-29 ENCOUNTER — Ambulatory Visit
Admission: RE | Admit: 2018-04-29 | Discharge: 2018-04-29 | Disposition: A | Discharge status: Home / Self Care | Payer: PRIVATE HEALTH INSURANCE | Source: Ambulatory Visit | Attending: Urology | Admitting: Urology

## 2018-04-29 DIAGNOSIS — N281 Cyst of kidney, acquired: Secondary | ICD-10-CM

## 2018-04-29 DIAGNOSIS — N200 Calculus of kidney: Secondary | ICD-10-CM

## 2018-05-26 ENCOUNTER — Encounter (HOSPITAL_COMMUNITY): Payer: Self-pay | Admitting: Urology

## 2018-08-22 IMAGING — CT CT RENAL STONE PROTOCOL
2 of 4 series · 10 of 46 positions shown, 11 images · non-contrast
Comparison: None.

CLINICAL DATA: On and off right flank pain since yesterday.

EXAM:
CT ABDOMEN AND PELVIS WITHOUT CONTRAST
TECHNIQUE: Multidetector CT imaging of the abdomen and pelvis was performed
following the standard protocol without IV contrast.

[Series 201: stone study, idose (2) · axial · 0.79mm/px · z∈[+104,+529]mm · 7 of 103 slices shown, 8 images]
[im 9/103  soft-tissue]
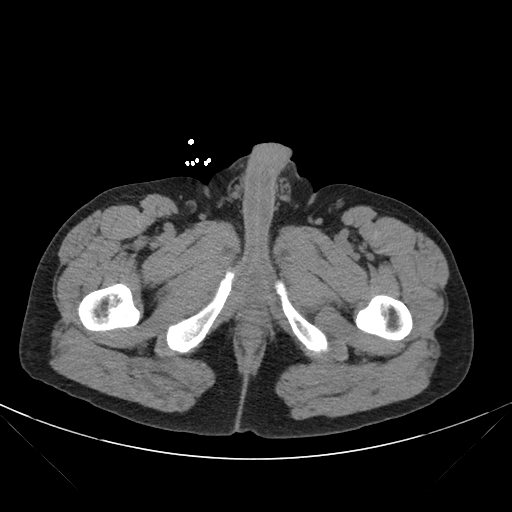
[im 9/103  bone]
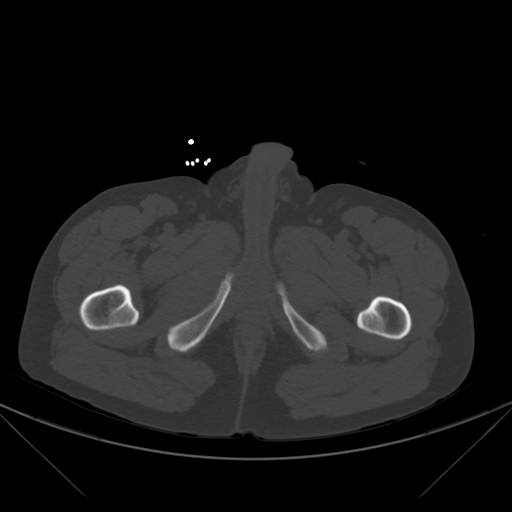
[im 23/103  soft-tissue]
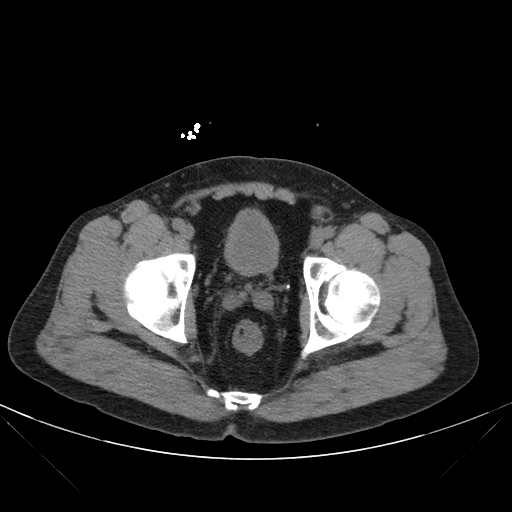
[im 36/103  soft-tissue]
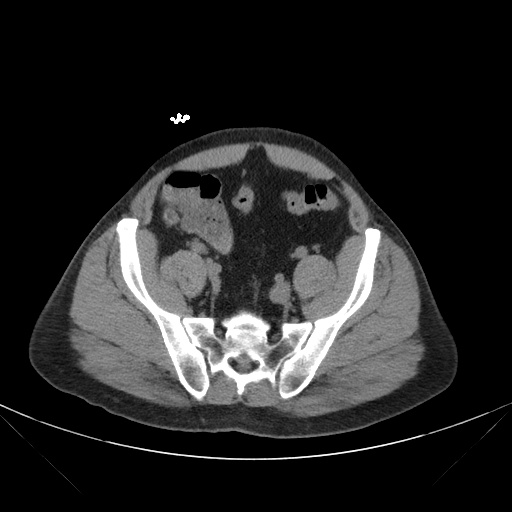
[im 54/103  soft-tissue]
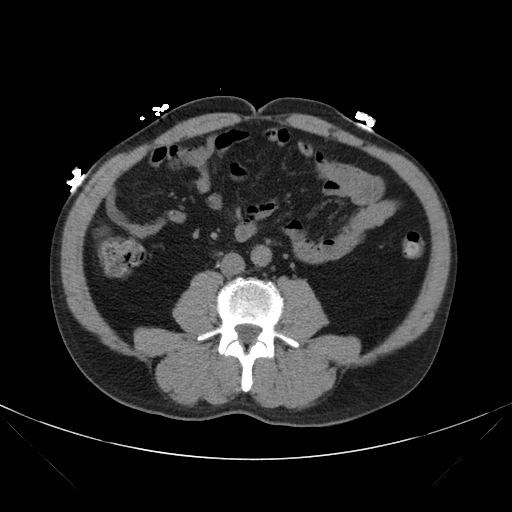
[im 67/103  soft-tissue]
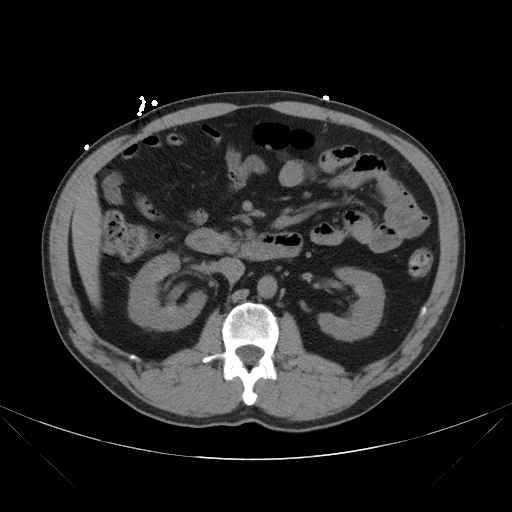
[im 80/103  soft-tissue]
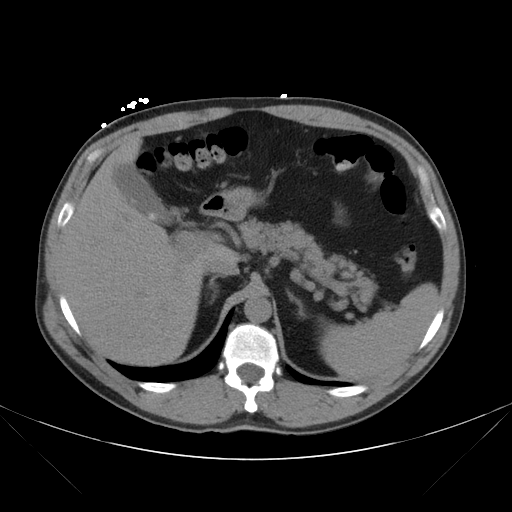
[im 94/103  soft-tissue]
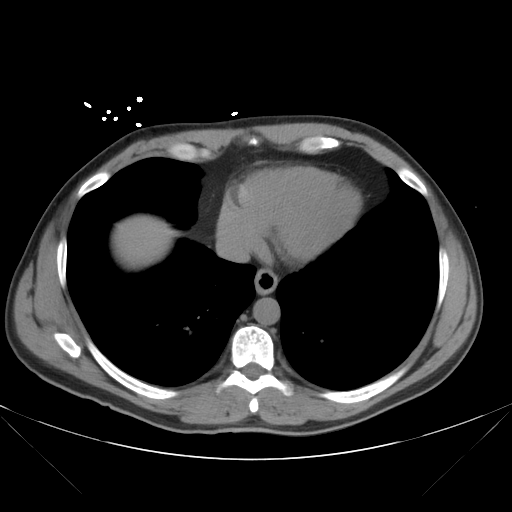

[Series 203: coronals, idose (2) · coronal · 0.45mm/px · 3 of 139 slices shown]
[im 47/139  soft-tissue]
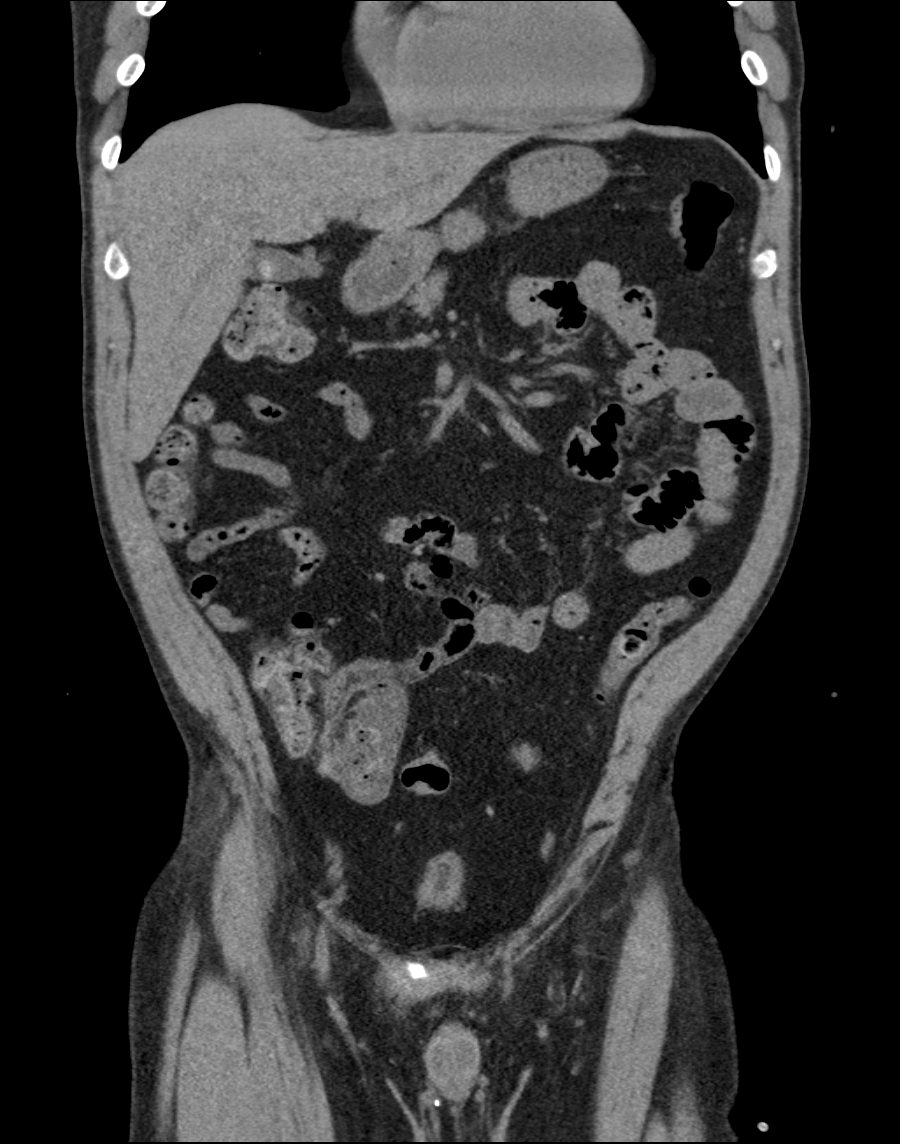
[im 62/139  soft-tissue]
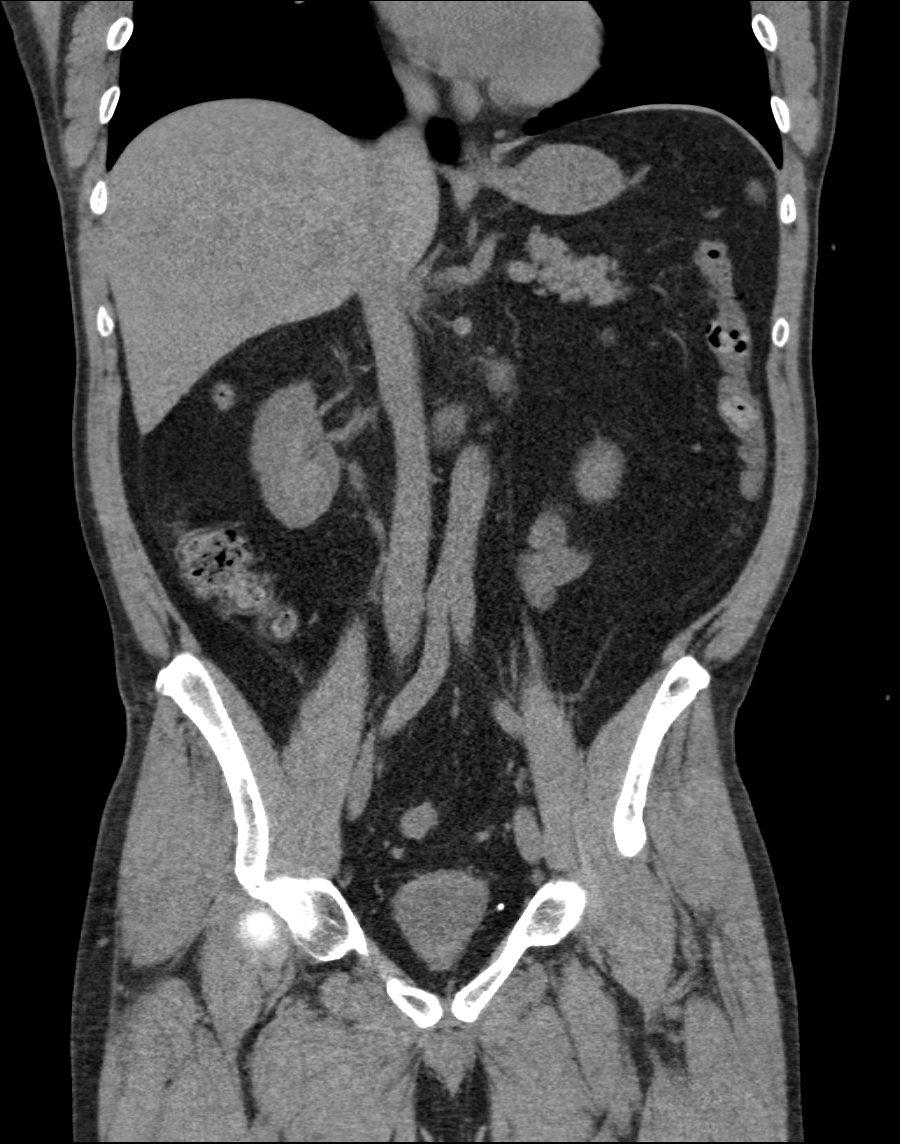
[im 77/139  soft-tissue]
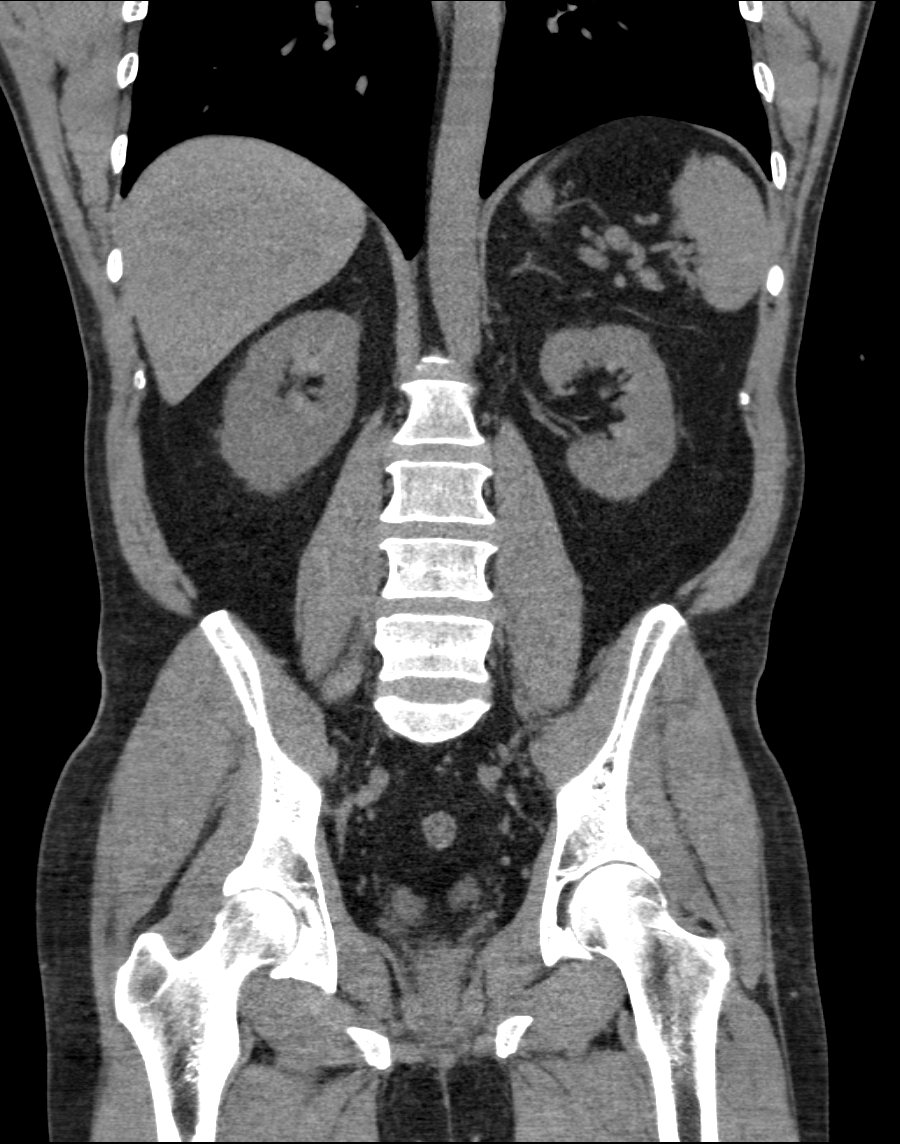

[10 of 46 positions shown; findings below may reference images not displayed]

FINDINGS: Lower chest:  Negative

Hepatobiliary: Subcentimeter low-density in the left liver, usually
a cyst.Cholelithiasis without signs of cholecystitis. No common bile
duct distention.

Pancreas: Unremarkable.

Spleen: Unremarkable.

Adrenals/Urinary Tract:  Negative adrenals.

2 mm stone in the mid right ureter, at the level of L4. Mild
hydronephrosis and upper hydroureter on the right. 3 mm right renal
calculus. No left-sided hydronephrosis.

Negative decompressed bladder.

Stomach/Bowel:  No obstruction. No appendicitis.

Vascular/Lymphatic: No acute vascular abnormality. No mass or
adenopathy.

Reproductive:Nonspecific prostate calcifications.

Other: No ascites or pneumoperitoneum.

Musculoskeletal: No acute abnormalities.
IMPRESSION: 1. 2 mm right mid ureteral stone with mild hydronephrosis.
2. 3 mm right renal calculus.
3. Cholelithiasis.

## 2019-11-18 IMAGING — US US RENAL
1 series · 14 of 25 positions shown · non-contrast
Comparison: 04/17/2018

CLINICAL DATA: History of renal calculi, possible cystic change

EXAM:
RENAL / URINARY TRACT ULTRASOUND COMPLETE

[Series 1: us renal · 0.23mm/px · 14 of 42 slices shown]
[im 1/42]
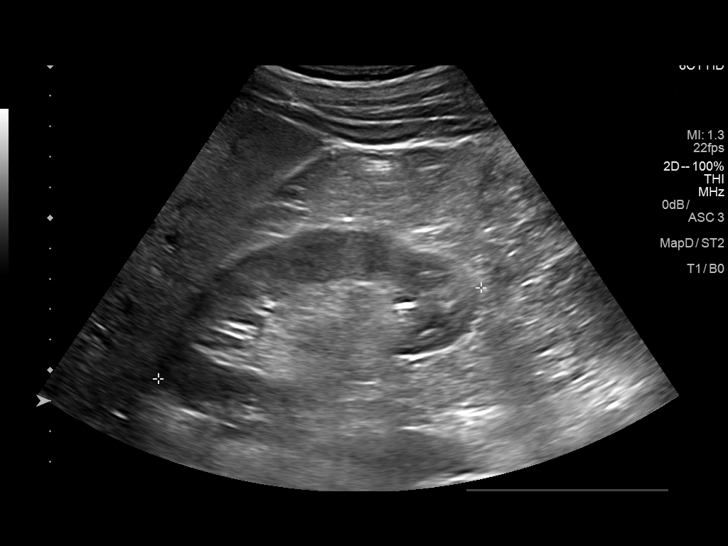
[im 4/42]
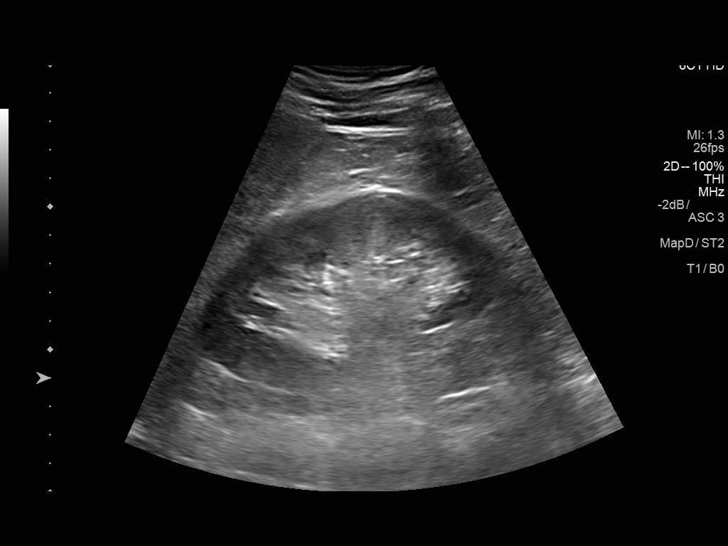
[im 7/42]
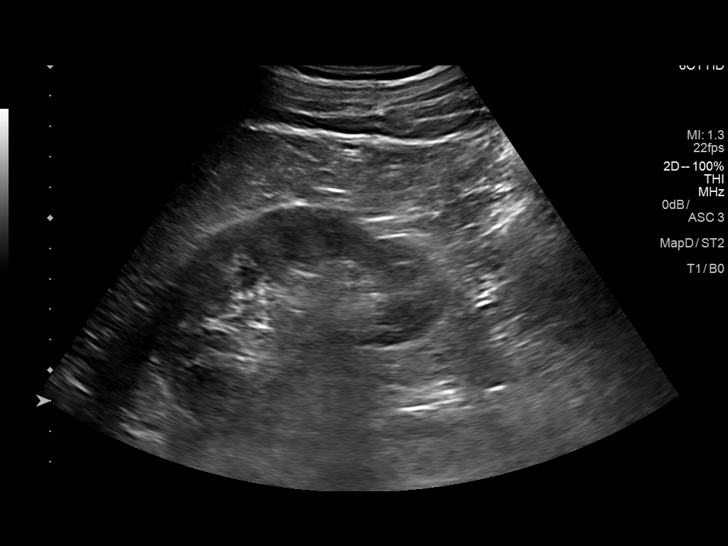
[im 11/42]
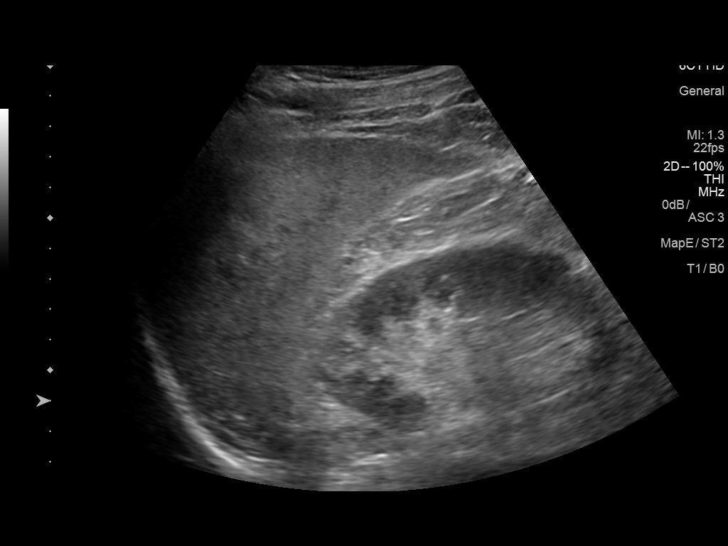
[im 14/42]
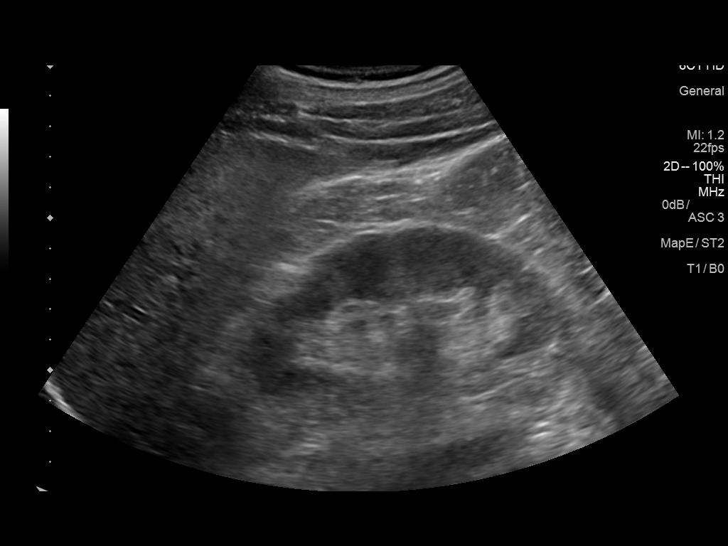
[im 16/42]
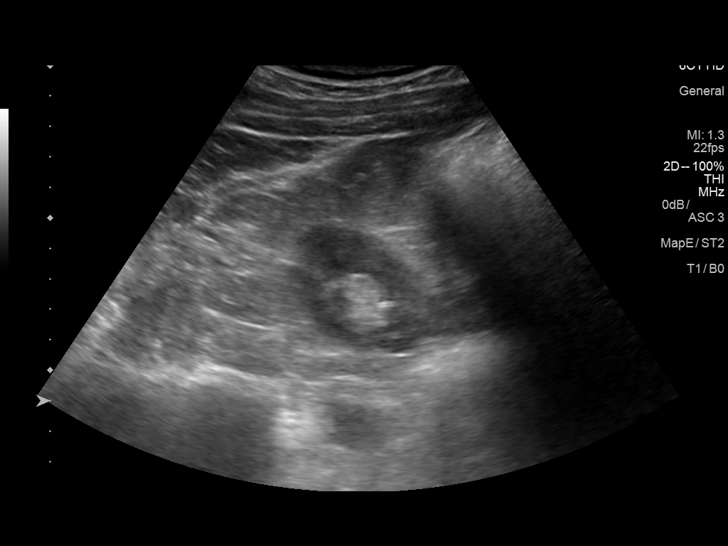
[im 19/42]
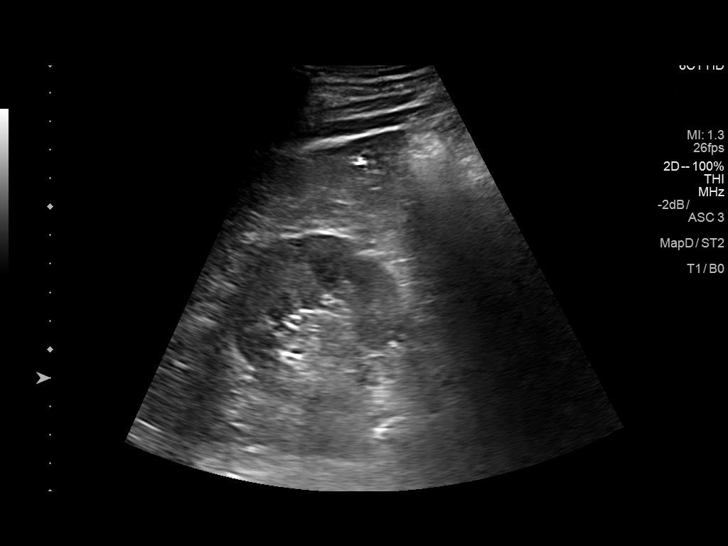
[im 23/42]
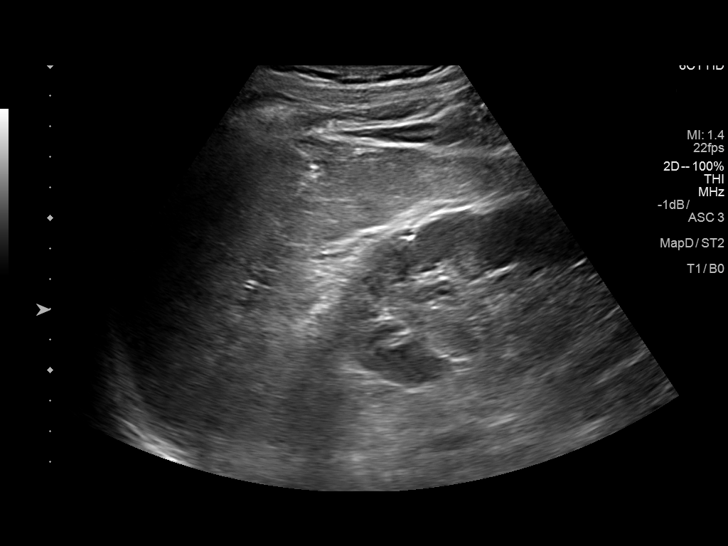
[im 26/42]
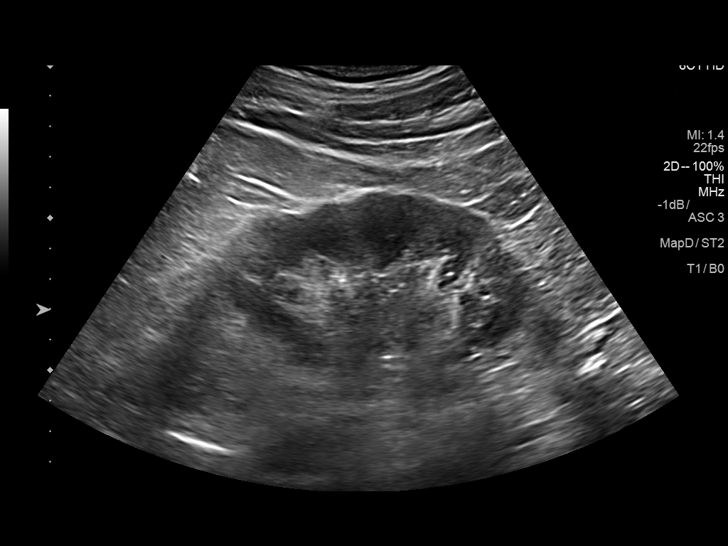
[im 28/42]
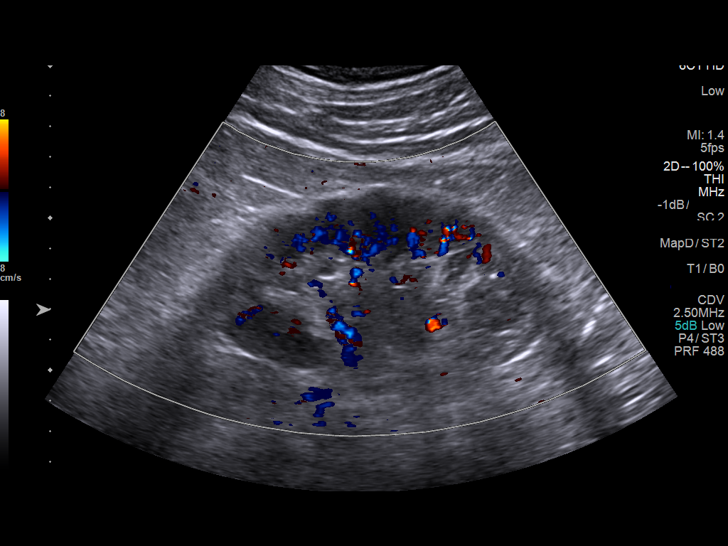
[im 31/42]
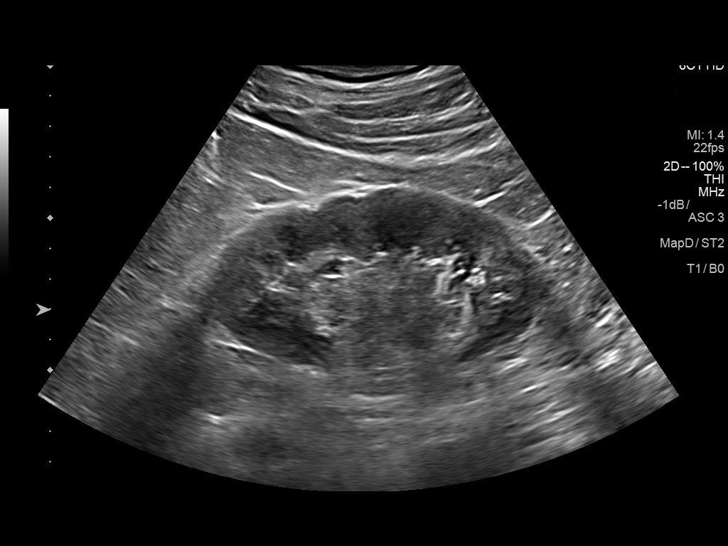
[im 35/42]
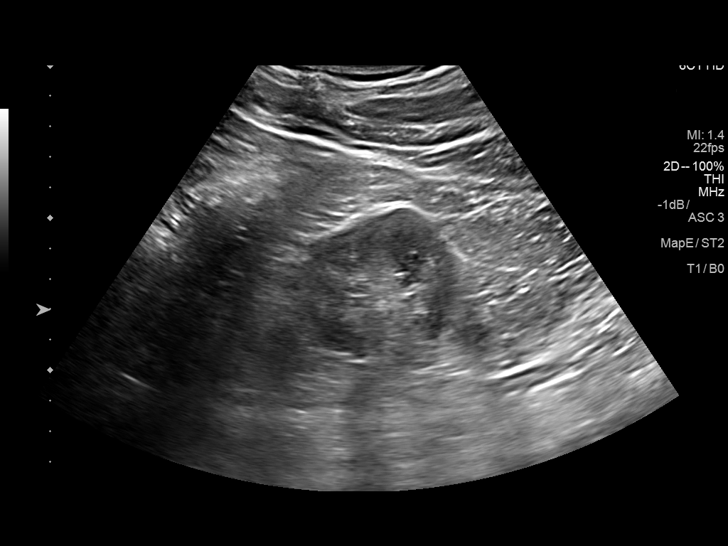
[im 38/42]
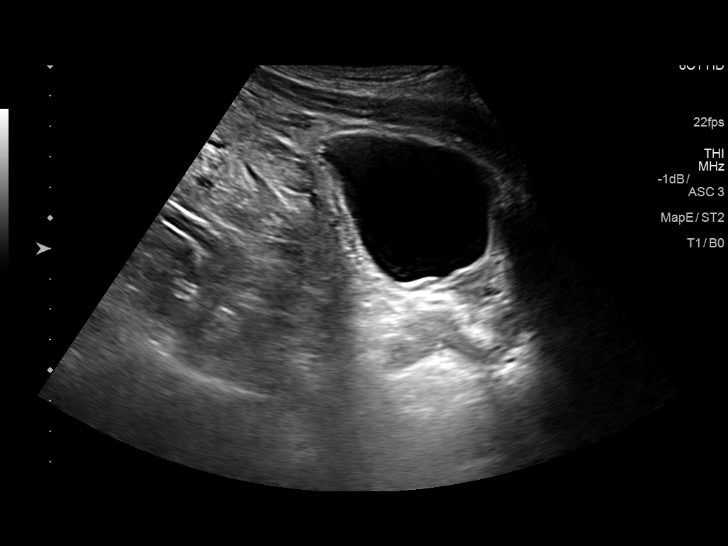
[im 42/42]
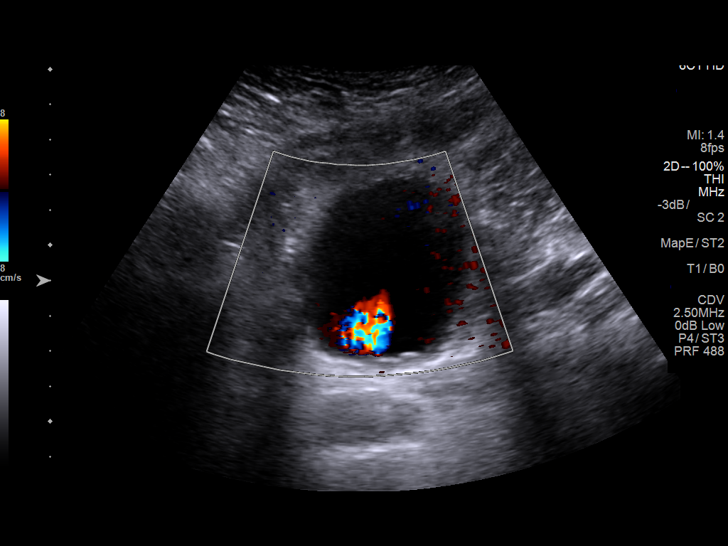

[14 of 25 positions shown; findings below may reference images not displayed]

FINDINGS: Right Kidney:

Length: 11.0 cm.. Echogenicity within normal limits. No mass or
hydronephrosis visualized. The previously seen hypoechoic areas are
felt to represent prominent renal pyramids. No cystic lesions are
noted on today's exam. No echogenic foci to suggest renal calculi
are noted.

Left Kidney:

Length: 11.1 cm.. Echogenicity within normal limits. No mass or
hydronephrosis visualized.

Bladder:

Appears normal for degree of bladder distention.
IMPRESSION: Normal-appearing kidneys bilaterally. The hypoechoic areas seen on
prior exam are likely related to renal pyramids. No definitive
calculi are noted.

## 2020-07-18 ENCOUNTER — Ambulatory Visit (INDEPENDENT_AMBULATORY_CARE_PROVIDER_SITE_OTHER): Payer: Commercial Managed Care - PPO | Admitting: Dermatology

## 2020-07-18 ENCOUNTER — Encounter: Payer: Self-pay | Admitting: Dermatology

## 2020-07-18 ENCOUNTER — Other Ambulatory Visit: Payer: Self-pay

## 2020-07-18 DIAGNOSIS — Z1283 Encounter for screening for malignant neoplasm of skin: Secondary | ICD-10-CM

## 2020-07-18 DIAGNOSIS — L821 Other seborrheic keratosis: Secondary | ICD-10-CM

## 2020-07-18 DIAGNOSIS — L409 Psoriasis, unspecified: Secondary | ICD-10-CM | POA: Diagnosis not present

## 2020-07-18 DIAGNOSIS — Z85828 Personal history of other malignant neoplasm of skin: Secondary | ICD-10-CM

## 2020-07-18 MED ORDER — MOMETASONE FUROATE 0.1 % EX OINT: TOPICAL_OINTMENT | Freq: Every day | CUTANEOUS | 6 refills | Status: DC

## 2020-07-21 ENCOUNTER — Encounter: Payer: Self-pay | Admitting: Dermatology

## 2020-07-21 NOTE — Progress Notes (Signed)
   Follow-Up Visit   Subjective  Craig Nunez is a 59 y.o. male who presents for the following: Annual Exam (Right arm- few spots & wants back checked).  General skin check plus refill psoriasis medication Location:  Duration:  Quality:  Associated Signs/Symptoms: Modifying Factors:  Severity:  Timing: Context: History of skin cancer  Objective  Well appearing patient in no apparent distress; mood and affect are within normal limits. Objective  Left Nasal Sidewall: No recurrence  Objective  Neck - Anterior: No atypical moles or melanoma, complete skin examination  Objective  Neck - Anterior, Right Upper Arm - Anterior: Half dozen 3 to 7 mm brown flattopped textured papules  Objective  Head - Anterior (Face): Few small psoriasiform patches mainly behind ear and scalp.  Some component of neurodermatitis.   A full examination was performed including scalp, head, eyes, ears, nose, lips, neck, chest, axillae, abdomen, back, buttocks, bilateral upper extremities, bilateral lower extremities, hands, feet, fingers, toes, fingernails, and toenails. All findings within normal limits unless otherwise noted below.   Assessment & Plan    History of basal cell carcinoma (BCC) Left Nasal Sidewall  Encouraged to examine his skin twice annually, once yearly dermatologist  Skin exam for malignant neoplasm Neck - Anterior  Yearly skin check  Seborrheic keratosis (2) Neck - Anterior; Right Upper Arm - Anterior  Okay to leave if stable  Psoriasis Head - Anterior (Face)  Okay to refill refill mometasone.  Avoid use around the eyes and body folds.  Ordered Medications: mometasone (ELOCON) 0.1 % ointment     I, Lavonna Monarch, MD, have reviewed all documentation for this visit.  The documentation on 07/21/20 for the exam, diagnosis, procedures, and orders are all accurate and complete.

## 2020-08-22 DIAGNOSIS — Z8616 Personal history of COVID-19: Secondary | ICD-10-CM | POA: Insufficient documentation

## 2021-07-18 ENCOUNTER — Ambulatory Visit: Payer: Commercial Managed Care - PPO | Admitting: Dermatology

## 2021-10-17 ENCOUNTER — Ambulatory Visit: Payer: Commercial Managed Care - PPO | Admitting: Dermatology

## 2021-12-20 ENCOUNTER — Ambulatory Visit: Payer: Commercial Managed Care - PPO | Admitting: Dermatology

## 2022-04-11 ENCOUNTER — Ambulatory Visit: Payer: Commercial Managed Care - PPO | Admitting: Dermatology

## 2022-08-20 ENCOUNTER — Other Ambulatory Visit (HOSPITAL_COMMUNITY): Payer: Self-pay | Admitting: Internal Medicine

## 2022-08-20 ENCOUNTER — Ambulatory Visit (HOSPITAL_BASED_OUTPATIENT_CLINIC_OR_DEPARTMENT_OTHER)
Admission: RE | Admit: 2022-08-20 | Discharge: 2022-08-20 | Disposition: A | Discharge status: Home / Self Care | Payer: Commercial Managed Care - PPO | Source: Ambulatory Visit | Attending: Internal Medicine | Admitting: Internal Medicine

## 2022-08-20 DIAGNOSIS — Z87828 Personal history of other (healed) physical injury and trauma: Secondary | ICD-10-CM | POA: Insufficient documentation

## 2022-08-20 DIAGNOSIS — R519 Headache, unspecified: Secondary | ICD-10-CM | POA: Insufficient documentation

## 2022-08-20 LAB — POCT I-STAT CREATININE: Creatinine, Ser: 1.3 mg/dL — ABNORMAL HIGH (ref 0.61–1.24)

## 2022-08-20 MED ORDER — IOHEXOL 300 MG/ML  SOLN
100.0000 mL | Freq: Once | INTRAMUSCULAR | Status: AC | PRN
  Administered 2022-08-20: 100 mL via INTRAVENOUS

## 2022-10-04 ENCOUNTER — Encounter: Payer: Self-pay | Admitting: Psychiatry

## 2022-10-04 ENCOUNTER — Ambulatory Visit (INDEPENDENT_AMBULATORY_CARE_PROVIDER_SITE_OTHER): Payer: Commercial Managed Care - PPO | Admitting: Psychiatry

## 2022-10-04 VITALS — BP 126/77 | HR 60 | Ht 69.0 in | Wt 173.5 lb

## 2022-10-04 DIAGNOSIS — Z87442 Personal history of urinary calculi: Secondary | ICD-10-CM | POA: Insufficient documentation

## 2022-10-04 DIAGNOSIS — R519 Headache, unspecified: Secondary | ICD-10-CM

## 2022-10-04 DIAGNOSIS — Q283 Other malformations of cerebral vessels: Secondary | ICD-10-CM | POA: Diagnosis not present

## 2022-10-04 DIAGNOSIS — G441 Vascular headache, not elsewhere classified: Secondary | ICD-10-CM | POA: Diagnosis not present

## 2022-10-04 DIAGNOSIS — G43019 Migraine without aura, intractable, without status migrainosus: Secondary | ICD-10-CM | POA: Diagnosis not present

## 2022-10-04 DIAGNOSIS — R053 Chronic cough: Secondary | ICD-10-CM | POA: Insufficient documentation

## 2022-10-04 DIAGNOSIS — R531 Weakness: Secondary | ICD-10-CM | POA: Insufficient documentation

## 2022-10-04 DIAGNOSIS — D1800 Hemangioma unspecified site: Secondary | ICD-10-CM | POA: Diagnosis not present

## 2022-10-04 MED ORDER — UBRELVY 100 MG PO TABS: 100.0000 mg | ORAL_TABLET | ORAL | 6 refills | Status: DC | PRN

## 2022-10-04 MED ORDER — EMGALITY 120 MG/ML ~~LOC~~ SOAJ: 2.0000 | Freq: Once | SUBCUTANEOUS | 0 refills | Status: AC

## 2022-10-04 MED ORDER — EMGALITY 120 MG/ML ~~LOC~~ SOAJ: 1.0000 | SUBCUTANEOUS | 6 refills | Status: DC

## 2022-10-04 NOTE — Progress Notes (Signed)
Referring:  Haywood Pao, MD [address] [place],  [place] [address]  PCP: Osborne Casco, Fransico Him, MD  Neurology was asked to evaluate Erskine Squibb, a 62 year old male for a chief complaint of headaches.  Our recommendations of care will be communicated by shared medical record.    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 He has a history of migraines for several years following an [place] in 1997, but they became more severe one month ago after he had a strong sneeze. States he sneezed so hard he almost blacked out. Felt nauseated and developed a severe headache. He continues to have severe headaches which are worse with sneezing. Has pain and tenderness in his left temple and feels nauseated when he presses on it. Denies vision changes. Migraines are associated with photophobia and phonophobia. They can last up to 24 hours at a time. He is averaging ~14 headaches per month with 7 of these being migraines.  Headache History: Onset: 1997, worsened 1 month ago Triggers: sneezing Aura: none Associated Symptoms:  Photophobia: yes  Phonophobia: yes  Nausea: no Worse with activity?: yes Duration of headaches: 24 hours  Headache days per month: 14 Migraine days per month: 7 Headache free days per month: 16  Current Treatment: Abortive none  Preventative none  Prior Therapies                                 Rescue: Ibuprofen Imitrex - bradycardia Darvocet Phrenilin Seroquel 50 mg PRN - helped previously  Prevention: Cymbalta 60 mg daily Pristiq nortriptyline Lamictal Gabapentin  Lyrica 75 mg BID propranolol Topamax contraindicated due to kidney stones  LABS: CBC    Component Value Date/Time   WBC 7.4 11/23/2016 0652   RBC 4.57 11/23/2016 0652   HGB 13.3 11/23/2016 0652    HCT 39.5 11/23/2016 0652   PLT 247 11/23/2016 0652   MCV 86.4 11/23/2016 0652   MCH 29.1 11/23/2016 0652   MCHC 33.7 11/23/2016 0652   RDW 12.6 11/23/2016 0652      Latest Ref Rng & Units 08/20/2022    5:21 PM 11/23/2016    6:52 AM  CMP  Glucose 65 - 99 mg/dL  116   BUN 6 - 20 mg/dL  15   Creatinine 0.61 - 1.24 mg/dL 1.30  1.13   Sodium 135 - 145 mmol/L  141   Potassium 3.5 - 5.1 mmol/L  3.6   Chloride 101 - 111 mmol/L  104   CO2 22 - 32 mmol/L  26   Calcium 8.9 - 10.3 mg/dL  9.4   Total Protein 6.5 - 8.1 g/dL  6.2   Total Bilirubin 0.3 - 1.2 mg/dL  0.6   Alkaline Phos 38 - 126 U/L  66   AST 15 - 41 U/L  19   ALT 17 - 63 U/L  24      IMAGING:  CTH 08/20/22: no acute abnormality, small left frontal cavernoma  Imaging independently reviewed on October 04, 2022   Current Outpatient Medications on File Prior to Visit  Medication Sig Dispense Refill   desvenlafaxine (PRISTIQ) 50 MG 24 hr tablet Take 50 mg by mouth daily.      diazepam (VALIUM) 5 MG tablet Take 5 mg by mouth every 6 (  six) hours as needed for anxiety.     lamoTRIgine (LAMICTAL) 100 MG tablet Take 200 mg by mouth at bedtime.      Chlorpheniramine-PSE-Ibuprofen 2-30-200 MG TABS Take 1 tablet by mouth daily as needed (sinus pain).  (Patient not taking: Reported on 07/18/2020)     mometasone (ELOCON) 0.1 % ointment Apply topically daily. 45 g 6   ondansetron (ZOFRAN ODT) 4 MG disintegrating tablet Take 1 tablet (4 mg total) by mouth every 8 (eight) hours as needed for nausea or vomiting. (Patient not taking: Reported on 07/18/2020) 10 tablet 0   tamsulosin (FLOMAX) 0.4 MG CAPS capsule Take 1 capsule (0.4 mg total) by mouth 2 (two) times daily. (Patient not taking: Reported on 07/18/2020) 10 capsule 0   Current Facility-Administered Medications on File Prior to Visit  Medication Dose Route Frequency Provider Last Rate Last Admin   0.9 %  sodium chloride infusion  500 mL Intravenous Continuous Milus Banister, MD          Allergies: Allergies  Allergen Reactions   Penicillins Anaphylaxis   Latex Rash    Family History: Family History  Problem Relation Age of Onset   Breast cancer Mother    Breast cancer Sister    Colon polyps Brother    Colon cancer Neg Hx    Esophageal cancer Neg Hx    Rectal cancer Neg Hx    Stomach cancer Neg Hx      Past Medical History: Past Medical History:  Diagnosis Date   ADHD (attention deficit hyperactivity disorder)    Allergy    Anxiety    Depression    History of colon polyps    History of kidney stones    Psoriasis    Seizures ([place])    1997 from 02 deprivation    Past Surgical History Past Surgical History:  Procedure Laterality Date   colonscopy     EXTRACORPOREAL SHOCK WAVE LITHOTRIPSY Right 03/27/2018   Procedure: RIGHT EXTRACORPOREAL SHOCK WAVE LITHOTRIPSY (ESWL);  Surgeon: Ardis Hughs, MD;  Location: [facility];  Service: Urology;  Laterality: Right;   EYE SURGERY  2014   left eye    TRIGGER FINGER RELEASE     11-05-2014 right index middle     Social History: Social History   Tobacco Use   Smoking status: Never   Smokeless tobacco: Never  Vaping Use   Vaping Use: Never used  Substance Use Topics   Alcohol use: No   Drug use: No    ROS: Negative for fevers, chills. Positive for headaches. All other systems reviewed and negative unless stated otherwise in HPI.   Physical Exam:   Vital Signs: BP 126/77   Pulse 60   Ht '5\' 9"'$  (1.753 m)   Wt 173 lb 8 oz (78.7 kg)   BMI 25.62 kg/m  GENERAL: well appearing,in no acute distress,alert SKIN:  Color, texture, turgor normal. No rashes or lesions HEAD:  Normocephalic/atraumatic. CV:  RRR RESP: Normal respiratory effort MSK: no tenderness to palpation over occiput, neck, or shoulders  NEUROLOGICAL: Mental Status: Alert, oriented to person, place and time,Follows commands Cranial Nerves: PERRL, visual fields intact to confrontation, extraocular movements intact, facial  sensation intact, no facial droop or ptosis, hearing grossly intact, no dysarthria Motor: muscle strength 5/5 both upper and lower extremities,no drift, normal tone Reflexes: 2+ throughout Sensation: intact to light touch all 4 extremities Coordination: Finger-to- nose-finger intact bilaterally Gait: normal-based   IMPRESSION: 62 year old male with a history of  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 Will order CTA head and MRI brain as he reports vascular headache pattern with history of cavernous angioma. Will also check inflammatory markers given left temporal tenderness on exam. For his migraines, will start Emgality for prevention and Ubrelvy for rescue.  PLAN: -MRI brain -CTA head -Blood work: ESR, CRP -Prevention: Start Emgality 120 mg monthly -Rescue: Start Ubrelvy 100 mg PRN   I spent a total of 40 minutes chart reviewing and counseling the patient. Headache education was done. Discussed treatment options including preventive and acute medications. Discussed medication side effects, adverse reactions and drug interactions. Written educational materials and patient instructions outlining all of the above were given.  Follow-up: 8 months   Genia Harold, MD 10/04/2022   9:00 AM

## 2022-10-04 NOTE — Patient Instructions (Signed)
CTA of the blood vessels in the head MRI of the brain Blood work to look for inflammation Start Emgality monthly for migraine prevention Start [place] as needed for migraines. Take one pill at onset of migraine. May repeat a dose in 2 hours if needed.

## 2022-10-05 ENCOUNTER — Telehealth: Payer: Self-pay | Admitting: Psychiatry

## 2022-10-05 LAB — SEDIMENTATION RATE: Sed Rate: 2 mm/hr (ref 0–30)

## 2022-10-05 LAB — C-REACTIVE PROTEIN: CRP: <1 mg/L (ref 0–10)

## 2022-10-05 NOTE — Telephone Encounter (Signed)
Pt called stated he is getting an MRI and a CT Scan. Pt wants to know if it's okay for him to take diazepam (VALIUM) 5 MG tablet  before going to get test. Pt is requesting a call back from nurse.

## 2022-10-08 ENCOUNTER — Telehealth: Payer: Self-pay | Admitting: Psychiatry

## 2022-10-08 NOTE — Telephone Encounter (Signed)
Yes, that's fine as long as he has someone to drive him

## 2022-10-08 NOTE — Telephone Encounter (Signed)
Jacqlyn Krauss Josem KaufmannFQ:[id number] exp. 10/04/22-01/02/23 for [place]

## 2022-10-08 NOTE — Telephone Encounter (Signed)
LVM for pt relaying Dr. Georgina Peer message. Asked him to call back if any questions.

## 2022-10-11 ENCOUNTER — Ambulatory Visit (HOSPITAL_COMMUNITY)
Admission: RE | Admit: 2022-10-11 | Discharge: 2022-10-11 | Disposition: A | Discharge status: Home / Self Care | Payer: Commercial Managed Care - PPO | Source: Ambulatory Visit | Attending: Psychiatry | Admitting: Psychiatry

## 2022-10-11 ENCOUNTER — Other Ambulatory Visit: Payer: Self-pay | Admitting: Psychiatry

## 2022-10-11 DIAGNOSIS — Q283 Other malformations of cerebral vessels: Secondary | ICD-10-CM | POA: Insufficient documentation

## 2022-10-11 DIAGNOSIS — G441 Vascular headache, not elsewhere classified: Secondary | ICD-10-CM | POA: Diagnosis present

## 2022-10-11 MED ORDER — IOHEXOL 350 MG/ML SOLN
75.0000 mL | Freq: Once | INTRAVENOUS | Status: AC | PRN
  Administered 2022-10-11: 75 mL via INTRAVENOUS

## 2022-10-11 MED ORDER — GADOBUTROL 1 MMOL/ML IV SOLN
7.5000 mL | Freq: Once | INTRAVENOUS | Status: AC | PRN
  Administered 2022-10-11: 7.5 mL via INTRAVENOUS

## 2022-10-14 ENCOUNTER — Encounter: Payer: Self-pay | Admitting: Psychiatry

## 2022-10-17 ENCOUNTER — Telehealth: Payer: Self-pay

## 2022-10-17 NOTE — Telephone Encounter (Signed)
Called pt about his MRI Results. His MRI Results were No major changes. Continue current plan

## 2022-10-17 NOTE — Telephone Encounter (Signed)
Called pt and told him his CT results No major changes from prior CT. Continue current plan. Pt verbalized understanding.

## 2022-10-18 NOTE — Telephone Encounter (Signed)
Called [facility] at [phone]. Spoke w/ tech. Ubrelvy ready for pick up. Emgality showing there is a delay with insurance. Unclear if PA needed. Transferred to pharmacy.  Emgality needs PA. Confirmed Ubrelvy ready for pick up. Tried submitting PA Emgality on covermymeds. Key: [id number]. Received the following response: "[employer] does not handle this review. Please visit [email or web address] to start a prior authorization or fax information to [phone]. Please include all supporting chart notes. You may contact [employer] at [phone]." Tried submitting here instead twice but kept getting message: "Configuration issue found for this member. Please call customer service at [phone]."

## 2022-10-22 NOTE — Telephone Encounter (Signed)
Submitted PA Emgality at [email or web address]. Prior Auth (EOC) ID:  [id number]. Waiting on determination.

## 2022-10-23 ENCOUNTER — Other Ambulatory Visit: Payer: Self-pay | Admitting: Psychiatry

## 2022-10-23 MED ORDER — AIMOVIG 70 MG/ML ~~LOC~~ SOAJ: 70.0000 mg | SUBCUTANEOUS | 8 refills | Status: DC

## 2022-10-23 NOTE — Telephone Encounter (Signed)
We can do Aimovig instead of Emgality. I sent a new rx to his pharmacy

## 2022-10-23 NOTE — Addendum Note (Signed)
Addended by: Genia Harold on: 10/23/2022 08:47 AM   Modules accepted: Orders

## 2023-02-27 ENCOUNTER — Encounter: Payer: Self-pay | Admitting: Psychiatry

## 2023-04-17 ENCOUNTER — Telehealth: Payer: Self-pay | Admitting: Family Medicine

## 2023-04-17 NOTE — Telephone Encounter (Signed)
LVM and sent mychart msg informing pt of need to reschedule 06/11/23 appt - office closed

## 2023-06-06 ENCOUNTER — Ambulatory Visit: Payer: Commercial Managed Care - PPO | Admitting: Psychiatry

## 2023-06-11 ENCOUNTER — Ambulatory Visit: Payer: Commercial Managed Care - PPO | Admitting: Family Medicine

## 2023-10-15 NOTE — Progress Notes (Unsigned)
 No chief complaint on file.   HISTORY OF PRESENT ILLNESS:  10/15/23 ALL:  Craig Nunez is a 63 y.o. male here today for follow up for migraines. He was seen in consult with Dr Delena Bali 09/2022 for severe migraines after sneezing. MRI/CTA unremarkable. Labs normal. He was started on [place]. Insurance approved [place] but required trial of Amovig.   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 He has a history of migraines for several years following an ICH in 1997, but they became more severe one month ago after he had a strong sneeze. States he sneezed so hard he almost blacked out. Felt nauseated and developed a severe headache. He continues to have severe headaches which are worse with sneezing. Has pain and tenderness in his left temple and feels nauseated when he presses on it. Denies vision changes. Migraines are associated with photophobia and phonophobia. They can last up to 24 hours at a time. He is averaging ~14 headaches per month with 7 of these being migraines.   Headache History: Onset: 1997, worsened 1 month ago Triggers: sneezing Aura: none Associated Symptoms:             Photophobia: yes             Phonophobia: yes             Nausea: no Worse with activity?: yes Duration of headaches: 24 hours   Headache days per month: 14 Migraine days per month: 7 Headache free days per month: 16   Current Treatment: Abortive none   Preventative none   Prior Therapies                                 Rescue: Ibuprofen Imitrex - bradycardia Darvocet Phrenilin Seroquel 50 mg PRN - helped previously   Prevention: Cymbalta 60 mg daily Pristiq nortriptyline Lamictal Gabapentin  Lyrica 75 mg BID propranolol Topamax contraindicated due to kidney  stones   REVIEW OF SYSTEMS: Out of a complete 14 system review of symptoms, the patient complains only of the following symptoms, and all other reviewed systems are negative.   ALLERGIES: Allergies  Allergen Reactions   Penicillins Anaphylaxis   Latex Rash     HOME MEDICATIONS: Outpatient Medications Prior to Visit  Medication Sig Dispense Refill   desvenlafaxine (PRISTIQ) 50 MG 24 hr tablet Take 50 mg by mouth daily.      diazepam (VALIUM) 5 MG tablet Take 5 mg by mouth every 6 (six) hours as needed for anxiety.     Erenumab-aooe (AIMOVIG) 70 MG/ML SOAJ Inject 70 mg into the skin every 30 (thirty) days. 1.12 mL 8   lamoTRIgine (LAMICTAL) 100 MG tablet Take 200 mg by mouth at bedtime.      ondansetron (ZOFRAN ODT) 4 MG disintegrating tablet Take 1 tablet (4 mg total) by mouth every 8 (eight) hours as needed for nausea or vomiting. (Patient not taking: Reported on 07/18/2020) 10 tablet 0   Ubrogepant (UBRELVY) 100 MG TABS Take 1 tablet (100 mg total) by mouth as needed. 16 tablet 6   Facility-Administered Medications Prior to Visit  Medication Dose Route Frequency Provider Last Rate Last Admin   0.9 %  sodium chloride infusion  500 mL Intravenous Continuous Christella Hartigan,  Melton Alar, MD         PAST MEDICAL HISTORY: Past Medical History:  Diagnosis Date   ADHD (attention deficit hyperactivity disorder)    Allergy    Anxiety    Depression    History of colon polyps    History of kidney stones    Psoriasis    Seizures (HCC)    1997 from 02 deprivation     PAST SURGICAL HISTORY: Past Surgical History:  Procedure Laterality Date   colonscopy     EXTRACORPOREAL SHOCK WAVE LITHOTRIPSY Right 03/27/2018   Procedure: RIGHT EXTRACORPOREAL SHOCK WAVE LITHOTRIPSY (ESWL);  Surgeon: Crist Fat, MD;  Location: [facility];  Service: Urology;  Laterality: Right;   EYE SURGERY  2014   left eye    TRIGGER FINGER RELEASE     11-05-2014 right index middle      FAMILY HISTORY: Family  History  Problem Relation Age of Onset   Breast cancer Mother    Breast cancer Sister    Colon polyps Brother    Colon cancer Neg Hx    Esophageal cancer Neg Hx    Rectal cancer Neg Hx    Stomach cancer Neg Hx      SOCIAL HISTORY: Social History   Socioeconomic History   Marital status: Married    Spouse name: Not on file   Number of children: Not on file   Years of education: Not on file   Highest education level: Not on file  Occupational History   Not on file  Tobacco Use   Smoking status: Never   Smokeless tobacco: Never  Vaping Use   Vaping status: Never Used  Substance and Sexual Activity   Alcohol use: No   Drug use: No   Sexual activity: Not on file  Other Topics Concern   Not on file  Social History Narrative   Not on file   Social Drivers of Health   Financial Resource Strain: Not on file  Food Insecurity: Not on file  Transportation Needs: Not on file  Physical Activity: Not on file  Stress: Not on file  Social Connections: Not on file  Intimate Partner Violence: Not on file     PHYSICAL EXAM  There were no vitals filed for this visit. There is no height or weight on file to calculate BMI.  Generalized: Well developed, in no acute distress  Cardiology: normal rate and rhythm, no murmur auscultated  Respiratory: clear to auscultation bilaterally    Neurological examination  Mentation: Alert oriented to time, place, history taking. Follows all commands speech and language fluent Cranial nerve II-XII: Pupils were equal round reactive to light. Extraocular movements were full, visual field were full on confrontational test. Facial sensation and strength were normal. Uvula tongue midline. Head turning and shoulder shrug  were normal and symmetric. Motor: The motor testing reveals 5 over 5 strength of all 4 extremities. Good symmetric motor tone is noted throughout.  Sensory: Sensory testing is intact to soft touch on all 4 extremities. No evidence  of extinction is noted.  Coordination: Cerebellar testing reveals good finger-nose-finger and heel-to-shin bilaterally.  Gait and station: Gait is normal. Tandem gait is normal. Romberg is negative. No drift is seen.  Reflexes: Deep tendon reflexes are symmetric and normal bilaterally.    DIAGNOSTIC DATA (LABS, IMAGING, TESTING) - I reviewed patient records, labs, notes, testing and imaging myself where available.  Lab Results  Component Value Date   WBC 7.4 11/23/2016   HGB  13.3 11/23/2016   HCT 39.5 11/23/2016   MCV 86.4 11/23/2016   PLT 247 11/23/2016      Component Value Date/Time   NA 141 11/23/2016 0652   K 3.6 11/23/2016 0652   CL 104 11/23/2016 0652   CO2 26 11/23/2016 0652   GLUCOSE 116 (H) 11/23/2016 0652   BUN 15 11/23/2016 0652   CREATININE 1.30 (H) 08/20/2022 1721   CALCIUM 9.4 11/23/2016 0652   PROT 6.2 (L) 11/23/2016 0652   ALBUMIN 3.7 11/23/2016 0652   AST 19 11/23/2016 0652   ALT 24 11/23/2016 0652   ALKPHOS 66 11/23/2016 0652   BILITOT 0.6 11/23/2016 0652   GFRNONAA >60 11/23/2016 0652   GFRAA >60 11/23/2016 0652   No results found for: "CHOL", "HDL", "LDLCALC", "LDLDIRECT", "TRIG", "CHOLHDL" No results found for: "HGBA1C" No results found for: "VITAMINB12" No results found for: "TSH"      No data to display               No data to display           ASSESSMENT AND PLAN  63 y.o. year old male  has a past medical history of ADHD (attention deficit hyperactivity disorder), Allergy, Anxiety, Depression, History of colon polyps, History of kidney stones, Psoriasis, and Seizures (HCC). here with    No diagnosis found.  Yevonne Aline ***.  Healthy lifestyle habits encouraged. *** will follow up with PCP as directed. *** will return to see me in ***, sooner if needed. *** verbalizes understanding and agreement with this plan.   No orders of the defined types were placed in this encounter.    No orders of the defined types were placed  in this encounter.    Shawnie Dapper, MSN, FNP-C 10/15/2023, 9:10 AM  [facility] [address], [address] [place], [place] [address] [phone]

## 2023-10-15 NOTE — Patient Instructions (Signed)
 Below is our plan:  We will start Emgality injections every 30 days. We will switch Ubrelvy to Nurtec. You can take 1 tablet daily as needed to stop headache.   Please make sure you are staying well hydrated. I recommend 50-60 ounces daily. Well balanced diet and regular exercise encouraged. Consistent sleep schedule with 6-8 hours recommended.   Please continue follow up with care team as directed.   Follow up with me in 6 months   You may receive a survey regarding today's visit. I encourage you to leave honest feed back as I do use this information to improve patient care. Thank you for seeing me today!   GENERAL HEADACHE INFORMATION:   Natural supplements: Magnesium Oxide or Magnesium Glycinate 500 mg at bed (up to 800 mg daily) Coenzyme Q10 300 mg in AM Vitamin B2- 200 mg twice a day   Add 1 supplement at a time since even natural supplements can have undesirable side effects. You can sometimes buy supplements cheaper (especially Coenzyme Q10) at www.WebmailGuide.co.za or at [facility].  Migraine with aura: There is increased risk for stroke in women with migraine with aura and a contraindication for the combined contraceptive pill for use by women who have migraine with aura. The risk for women with migraine without aura is lower. However other risk factors like smoking are far more likely to increase stroke risk than migraine. There is a recommendation for no smoking and for the use of OCPs without estrogen such as progestogen only pills particularly for women with migraine with aura.Marland Kitchen People who have migraine headaches with auras may be 3 times more likely to have a stroke caused by a blood clot, compared to migraine patients who don't see auras. Women who take hormone-replacement therapy may be 30 percent more likely to suffer a clot-based stroke than women not taking medication containing estrogen. Other risk factors like smoking and high blood pressure may be  much more important.    Vitamins  and herbs that show potential:   Magnesium: Magnesium (250 mg twice a day or 500 mg at bed) has a relaxant effect on smooth muscles such as blood vessels. Individuals suffering from frequent or daily headache usually have low magnesium levels which can be increase with daily supplementation of 400-750 mg. Three trials found 40-90% average headache reduction  when used as a preventative. Magnesium may help with headaches are aura, the best evidence for magnesium is for migraine with aura is its thought to stop the cortical spreading depression we believe is the pathophysiology of migraine aura.Magnesium also demonstrated the benefit in menstrually related migraine.  Magnesium is part of the messenger system in the serotonin cascade and it is a good muscle relaxant.  It is also useful for constipation which can be a side effect of other medications used to treat migraine. Good sources include nuts, whole grains, and tomatoes. Side Effects: loose stool/diarrhea  Riboflavin (vitamin B 2) 200 mg twice a day. This vitamin assists nerve cells in the production of ATP a principal energy storing molecule.  It is necessary for many chemical reactions in the body.  There have been at least 3 clinical trials of riboflavin using 400 mg per day all of which suggested that migraine frequency can be decreased.  All 3 trials showed significant improvement in over half of migraine sufferers.  The supplement is found in bread, cereal, milk, meat, and poultry.  Most Americans get more riboflavin than the recommended daily allowance, however riboflavin deficiency is not  necessary for the supplements to help prevent headache. Side effects: energizing, green urine   Coenzyme Q10: This is present in almost all cells in the body and is critical component for the conversion of energy.  Recent studies have shown that a nutritional supplement of CoQ10 can reduce the frequency of migraine attacks by improving the energy production of  cells as with riboflavin.  Doses of 150 mg twice a day have been shown to be effective.   Melatonin: Increasing evidence shows correlation between melatonin secretion and headache conditions.  Melatonin supplementation has decreased headache intensity and duration.  It is widely used as a sleep aid.  Sleep is natures way of dealing with migraine.  A dose of 3 mg is recommended to start for headaches including cluster headache. Higher doses up to 15 mg has been reviewed for use in Cluster headache and have been used. The rationale behind using melatonin for cluster is that many theories regarding the cause of Cluster headache center around the disruption of the normal circadian rhythm in the brain.  This helps restore the normal circadian rhythm.   HEADACHE DIET: Foods and beverages which may trigger migraine Note that only 20% of headache patients are food sensitive. You will know if you are food sensitive if you get a headache consistently 20 minutes to 2 hours after eating a certain food. Only cut out a food if it causes headaches, otherwise you might remove foods you enjoy! What matters most for diet is to eat a well balanced healthy diet full of vegetables and low fat protein, and to not miss meals.   Chocolate, other sweets ALL cheeses except cottage and cream cheese Dairy products, yogurt, sour cream, ice cream Liver Meat extracts (Bovril, Marmite, meat tenderizers) Meats or fish which have undergone aging, fermenting, pickling or smoking. These include: Hotdogs,salami,Lox,sausage, mortadellas,smoked salmon, pepperoni, Pickled herring Pods of broad bean (English beans, Chinese pea pods, [place] (fava) beans, lima and navy beans Ripe avocado, ripe banana Yeast extracts or active yeast preparations such as Brewer's or Fleishman's (commercial bakes goods are permitted) Tomato based foods, pizza (lasagna, etc.)   MSG (monosodium glutamate) is disguised as many things; look for these common  aliases: Monopotassium glutamate Autolysed yeast Hydrolysed protein Sodium caseinate "flavorings" "all natural preservatives" Nutrasweet   Avoid all other foods that convincingly provoke headaches.   Resources: The Dizzy [place] Laundry Your Headache Diet, migrainestrong.com  https://zamora-andrews.com/   Caffeine and Migraine For patients that have migraine, caffeine intake more than 3 days per week can lead to dependency and increased migraine frequency. I would recommend cutting back on your caffeine intake as best you can. The recommended amount of caffeine is 200-300 mg daily, although migraine patients may experience dependency at even lower doses. While you may notice an increase in headache temporarily, cutting back will be helpful for headaches in the long run. For more information on caffeine and migraine, visit: https://americanmigrainefoundation.org/resource-library/caffeine-and-migraine/   Headache Prevention Strategies:   1. Maintain a headache diary; learn to identify and avoid triggers.  - This can be a simple note where you log when you had a headache, associated symptoms, and medications used - There are several smartphone apps developed to help track migraines: Migraine Buddy, Migraine Monitor, Curelator N1-Headache App   Common triggers include: Emotional triggers: Emotional/Upset family or friends Emotional/Upset occupation Business reversal/success Anticipation anxiety Crisis-serious Post-crisis periodNew job/position   Physical triggers: Vacation Day Weekend Strenuous Exercise High Altitude Location New Move Menstrual Day Physical Illness Oversleep/Not enough sleep  Weather changes Light: Photophobia or light sesnitivity treatment involves a balance between desensitization and reduction in overly strong input. Use dark polarized glasses outside, but not inside. Avoid bright or fluorescent light, but do not dim  environment to the point that going into a normally lit room hurts. Consider FL-41 tint lenses, which reduce the most irritating wavelengths without blocking too much light.  These can be obtained at axonoptics.com or theraspecs.com Foods: see list above.   2. Limit use of acute treatments (over-the-counter medications, triptans, etc.) to no more than 2 days per week or 10 days per month to prevent medication overuse headache (rebound headache).     3. Follow a regular schedule (including weekends and holidays): Don't skip meals. Eat a balanced diet. 8 hours of sleep nightly. Minimize stress. Exercise 30 minutes per day. Being overweight is associated with a 5 times increased risk of chronic migraine. Keep well hydrated and drink 6-8 glasses of water per day.   4. Initiate non-pharmacologic measures at the earliest onset of your headache. Rest and quiet environment. Relax and reduce stress. Breathe2Relax is a free app that can instruct you on    some simple relaxtion and breathing techniques. Http://Dawnbuse.com is a    free website that provides teaching videos on relaxation.  Also, there are  many apps that   can be downloaded for "mindful" relaxation.  An app called YOGA NIDRA will help walk you through mindfulness. Another app called Calm can be downloaded to give you a structured mindfulness guide with daily reminders and skill development. Headspace for guided meditation Mindfulness Based Stress Reduction Online Course: www.palousemindfulness.com Cold compresses.   5. Don't wait!! Take the maximum allowable dosage of prescribed medication at the first sign of migraine.   6. Compliance:  Take prescribed medication regularly as directed and at the first sign of a migraine.   7. Communicate:  Call your physician when problems arise, especially if your headaches change, increase in frequency/severity, or become associated with neurological symptoms (weakness, numbness, slurred speech, etc.).  Proceed to emergency room if you experience new or worsening symptoms or symptoms do not resolve, if you have new neurologic symptoms or if headache is severe, or for any concerning symptom.   8. Headache/pain management therapies: Consider various complementary methods, including medication, behavioral therapy, psychological counselling, biofeedback, massage therapy, acupuncture, dry needling, and other modalities.  Such measures may reduce the need for medications. Counseling for pain management, where patients learn to function and ignore/minimize their pain, seems to work very well.   9. Recommend changing family's attention and focus away from patient's headaches. Instead, emphasize daily activities. If first question of day is 'How are your headaches/Do you have a headache today?', then patient will constantly think about headaches, thus making them worse. Goal is to re-direct attention away from headaches, toward daily activities and other distractions.   10. Helpful Websites: www.AmericanHeadacheSociety.org PatentHood.ch www.headaches.org TightMarket.nl www.achenet.org

## 2023-10-16 ENCOUNTER — Encounter: Payer: Self-pay | Admitting: Family Medicine

## 2023-10-16 ENCOUNTER — Ambulatory Visit (INDEPENDENT_AMBULATORY_CARE_PROVIDER_SITE_OTHER): Payer: Commercial Managed Care - PPO | Admitting: Family Medicine

## 2023-10-16 VITALS — BP 124/75 | HR 102 | Ht 69.0 in | Wt 178.0 lb

## 2023-10-16 DIAGNOSIS — G43019 Migraine without aura, intractable, without status migrainosus: Secondary | ICD-10-CM | POA: Diagnosis not present

## 2023-10-16 DIAGNOSIS — D1800 Hemangioma unspecified site: Secondary | ICD-10-CM | POA: Diagnosis not present

## 2023-10-16 DIAGNOSIS — Q283 Other malformations of cerebral vessels: Secondary | ICD-10-CM | POA: Diagnosis not present

## 2023-10-16 DIAGNOSIS — R519 Headache, unspecified: Secondary | ICD-10-CM

## 2023-10-16 MED ORDER — EMGALITY 120 MG/ML ~~LOC~~ SOAJ: 120.0000 mg | SUBCUTANEOUS | 3 refills | Status: DC

## 2023-10-16 MED ORDER — NURTEC 75 MG PO TBDP: 75.0000 mg | ORAL_TABLET | Freq: Every day | ORAL | 11 refills | Status: AC | PRN

## 2023-10-16 MED ORDER — EMGALITY 120 MG/ML ~~LOC~~ SOAJ: 240.0000 mg | Freq: Once | SUBCUTANEOUS | 0 refills | Status: AC

## 2023-10-28 ENCOUNTER — Telehealth: Payer: Self-pay

## 2023-10-28 ENCOUNTER — Other Ambulatory Visit (HOSPITAL_COMMUNITY): Payer: Self-pay

## 2023-10-28 NOTE — Telephone Encounter (Signed)
 Pharmacy Patient Advocate Encounter   Received notification from Fax that prior authorization for Emgality 120mg /ml autoInjector is required/requested.   Insurance verification completed.   The patient is insured through [facility] .   Per test claim: PA required; PA submitted to above mentioned insurance via CoverMyMeds Key/confirmation #/[id number] Status is pending

## 2023-10-30 ENCOUNTER — Other Ambulatory Visit: Payer: Self-pay | Admitting: Family Medicine

## 2023-10-30 MED ORDER — AJOVY 225 MG/1.5ML ~~LOC~~ SOAJ: 225.0000 mg | SUBCUTANEOUS | 3 refills | Status: AC

## 2023-10-30 NOTE — Telephone Encounter (Signed)
 Pharmacy Patient Advocate Encounter  Received notification from [facility] that Prior Authorization for Emgality has been DENIED.  Full denial letter will be uploaded to the media tab. See denial reason below.   PA #/Case ID/Reference #: [id number]

## 2023-10-30 NOTE — Telephone Encounter (Signed)
 Amy, looks like he has tried/failed the following out of options they want him to try: propranolol, topamax contraindicated, aimovig ineffective. I do not see where he has tried any other meds mentioned in denial letter to meet requirements for coverage of Emgality.  Did you want to switch preventative to one of covered options below?

## 2023-10-30 NOTE — Telephone Encounter (Signed)
 Spoke w/ Amy. She will call in Ajovy for pt instead to try.   I called pt and LVM letting him know Amy called in Ajovy instead and reasons why. Also relayed we will send to PA team to complete PA for Ajovy instead.

## 2023-10-31 ENCOUNTER — Other Ambulatory Visit (HOSPITAL_COMMUNITY): Payer: Self-pay

## 2023-10-31 ENCOUNTER — Telehealth: Payer: Self-pay

## 2023-10-31 NOTE — Telephone Encounter (Signed)
 Pharmacy Patient Advocate Encounter   Received notification from Physician's Office that prior authorization for Ajovy is required/requested.   Insurance verification completed.   The patient is insured through [facility] .   Per test claim: Refill too soon. PA is not needed at this time. Medication was filled 10/30/2023. Next eligible fill date is 01/06/2024.

## 2023-10-31 NOTE — Telephone Encounter (Addendum)
  Emgality was denied-and was asked to do PA for Ajovy. Per test claim no PA is needed-PT already filled medication on 10/30/2023 for an 84DS.

## 2023-10-31 NOTE — Telephone Encounter (Signed)
 See encounter from 10/31/23. Sent message back to [facility].

## 2023-10-31 NOTE — Telephone Encounter (Signed)
 Craig Nunez, I don't see any recent fills of Ajovy? We switched from Emgality to [place] since [place] denied. We were asking for PA on Ajovy for this reason

## 2023-10-31 NOTE — Telephone Encounter (Addendum)
 Per Epic, showing no recent fills of Ajovy. I called [facility] at [phone]. Spoke w/ Weston Brass (pharmacist).   Sent message about price: 24.98. Pt has not filled yet. They are waiting to hear from pt to ensure he is ok with price before filling. No PA required

## 2023-11-26 ENCOUNTER — Encounter

## 2024-05-06 ENCOUNTER — Ambulatory Visit: Admitting: Family Medicine

## 2024-12-01 ENCOUNTER — Ambulatory Visit: Admitting: Family Medicine
